# Patient Record
Sex: Male | Born: 1980 | Race: Black or African American | Hispanic: No | Marital: Single | Smoking: Current every day smoker
Health system: Southern US, Community
[De-identification: ages and names within clinical notes are randomized; demographics above are authoritative.]

## PROBLEM LIST (undated history)

## (undated) DIAGNOSIS — S71139A Puncture wound without foreign body, unspecified thigh, initial encounter: Secondary | ICD-10-CM

## (undated) DIAGNOSIS — Z87442 Personal history of urinary calculi: Secondary | ICD-10-CM

## (undated) DIAGNOSIS — W3400XA Accidental discharge from unspecified firearms or gun, initial encounter: Secondary | ICD-10-CM

---
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
## 2002-04-01 ENCOUNTER — Emergency Department (HOSPITAL_COMMUNITY): Admission: EM | Admit: 2002-04-01 | Discharge: 2002-04-01 | Payer: Self-pay | Admitting: Emergency Medicine

## 2004-01-22 ENCOUNTER — Emergency Department (HOSPITAL_COMMUNITY): Admission: EM | Admit: 2004-01-22 | Discharge: 2004-01-22 | Payer: Self-pay | Admitting: Emergency Medicine

## 2004-01-23 ENCOUNTER — Emergency Department (HOSPITAL_COMMUNITY): Admission: EM | Admit: 2004-01-23 | Discharge: 2004-01-23 | Payer: Self-pay | Admitting: Emergency Medicine

## 2004-01-24 ENCOUNTER — Emergency Department (HOSPITAL_COMMUNITY): Admission: EM | Admit: 2004-01-24 | Discharge: 2004-01-24 | Payer: Self-pay | Admitting: Emergency Medicine

## 2005-04-07 ENCOUNTER — Emergency Department (HOSPITAL_COMMUNITY): Admission: EM | Admit: 2005-04-07 | Discharge: 2005-04-07 | Payer: Self-pay | Admitting: Emergency Medicine

## 2007-01-03 ENCOUNTER — Emergency Department (HOSPITAL_COMMUNITY): Admission: EM | Admit: 2007-01-03 | Discharge: 2007-01-03 | Payer: Self-pay | Admitting: Emergency Medicine

## 2007-01-06 ENCOUNTER — Emergency Department (HOSPITAL_COMMUNITY): Admission: EM | Admit: 2007-01-06 | Discharge: 2007-01-06 | Payer: Self-pay | Admitting: Emergency Medicine

## 2007-01-23 ENCOUNTER — Emergency Department (HOSPITAL_COMMUNITY): Admission: EM | Admit: 2007-01-23 | Discharge: 2007-01-23 | Payer: Self-pay | Admitting: Emergency Medicine

## 2007-01-25 ENCOUNTER — Emergency Department (HOSPITAL_COMMUNITY): Admission: EM | Admit: 2007-01-25 | Discharge: 2007-01-25 | Payer: Self-pay | Admitting: Emergency Medicine

## 2007-02-07 ENCOUNTER — Emergency Department (HOSPITAL_COMMUNITY): Admission: EM | Admit: 2007-02-07 | Discharge: 2007-02-07 | Payer: Self-pay | Admitting: Emergency Medicine

## 2007-02-11 ENCOUNTER — Emergency Department (HOSPITAL_COMMUNITY): Admission: EM | Admit: 2007-02-11 | Discharge: 2007-02-11 | Payer: Self-pay | Admitting: Emergency Medicine

## 2007-02-15 ENCOUNTER — Emergency Department (HOSPITAL_COMMUNITY): Admission: EM | Admit: 2007-02-15 | Discharge: 2007-02-16 | Payer: Self-pay | Admitting: Emergency Medicine

## 2007-04-10 ENCOUNTER — Emergency Department (HOSPITAL_COMMUNITY): Admission: EM | Admit: 2007-04-10 | Discharge: 2007-04-10 | Payer: Self-pay | Admitting: Emergency Medicine

## 2007-05-15 ENCOUNTER — Emergency Department (HOSPITAL_COMMUNITY): Admission: EM | Admit: 2007-05-15 | Discharge: 2007-05-15 | Payer: Self-pay | Admitting: Emergency Medicine

## 2009-09-20 ENCOUNTER — Emergency Department (HOSPITAL_COMMUNITY): Admission: EM | Admit: 2009-09-20 | Discharge: 2009-09-20 | Payer: Self-pay | Admitting: Emergency Medicine

## 2009-09-21 ENCOUNTER — Emergency Department (HOSPITAL_COMMUNITY): Admission: EM | Admit: 2009-09-21 | Discharge: 2009-09-21 | Payer: Self-pay | Admitting: Emergency Medicine

## 2010-03-29 ENCOUNTER — Emergency Department (HOSPITAL_COMMUNITY): Admission: EM | Admit: 2010-03-29 | Discharge: 2010-03-29 | Payer: Self-pay | Admitting: Emergency Medicine

## 2010-10-20 LAB — URINE CULTURE: Culture: NO GROWTH

## 2010-10-20 LAB — URINE MICROSCOPIC-ADD ON

## 2010-10-20 LAB — URINALYSIS, ROUTINE W REFLEX MICROSCOPIC
Bilirubin Urine: NEGATIVE
Leukocytes, UA: NEGATIVE
Nitrite: NEGATIVE
Specific Gravity, Urine: 1.02 (ref 1.005–1.030)
Urobilinogen, UA: 0.2 mg/dL (ref 0.0–1.0)
pH: 5.5 (ref 5.0–8.0)

## 2010-10-31 ENCOUNTER — Emergency Department (HOSPITAL_COMMUNITY)
Admission: EM | Admit: 2010-10-31 | Discharge: 2010-10-31 | Disposition: A | Discharge status: Home / Self Care | Payer: Self-pay | Attending: Emergency Medicine | Admitting: Emergency Medicine

## 2010-10-31 DIAGNOSIS — K089 Disorder of teeth and supporting structures, unspecified: Secondary | ICD-10-CM | POA: Insufficient documentation

## 2010-10-31 DIAGNOSIS — K047 Periapical abscess without sinus: Secondary | ICD-10-CM | POA: Insufficient documentation

## 2011-01-25 ENCOUNTER — Emergency Department (HOSPITAL_COMMUNITY)
Admission: EM | Admit: 2011-01-25 | Discharge: 2011-01-25 | Disposition: A | Discharge status: Home / Self Care | Payer: Self-pay | Attending: Emergency Medicine | Admitting: Emergency Medicine

## 2011-01-25 DIAGNOSIS — X58XXXA Exposure to other specified factors, initial encounter: Secondary | ICD-10-CM | POA: Insufficient documentation

## 2011-01-25 DIAGNOSIS — S335XXA Sprain of ligaments of lumbar spine, initial encounter: Secondary | ICD-10-CM | POA: Insufficient documentation

## 2011-01-25 DIAGNOSIS — M549 Dorsalgia, unspecified: Secondary | ICD-10-CM | POA: Insufficient documentation

## 2011-01-25 DIAGNOSIS — K029 Dental caries, unspecified: Secondary | ICD-10-CM | POA: Insufficient documentation

## 2011-02-19 ENCOUNTER — Encounter: Payer: Self-pay | Admitting: *Deleted

## 2011-02-19 ENCOUNTER — Emergency Department (HOSPITAL_COMMUNITY)
Admission: EM | Admit: 2011-02-19 | Discharge: 2011-02-19 | Disposition: A | Discharge status: Home / Self Care | Payer: Self-pay | Attending: Emergency Medicine | Admitting: Emergency Medicine

## 2011-02-19 DIAGNOSIS — Z87891 Personal history of nicotine dependence: Secondary | ICD-10-CM | POA: Insufficient documentation

## 2011-02-19 DIAGNOSIS — R319 Hematuria, unspecified: Secondary | ICD-10-CM | POA: Insufficient documentation

## 2011-02-19 DIAGNOSIS — M545 Low back pain, unspecified: Secondary | ICD-10-CM | POA: Insufficient documentation

## 2011-02-19 LAB — URINALYSIS, ROUTINE W REFLEX MICROSCOPIC
Bilirubin Urine: NEGATIVE
Ketones, ur: NEGATIVE mg/dL
Leukocytes, UA: NEGATIVE
Nitrite: NEGATIVE
Specific Gravity, Urine: >1.03 — ABNORMAL HIGH (ref 1.005–1.030)
Urobilinogen, UA: 0.2 mg/dL (ref 0.0–1.0)

## 2011-02-19 MED ORDER — CIPROFLOXACIN HCL 250 MG PO TABS
500.0000 mg | ORAL_TABLET | Freq: Once | ORAL | Status: AC
Start: 2011-02-19 — End: 2011-02-19
  Administered 2011-02-19: 500 mg via ORAL
  Filled 2011-02-19: qty 2

## 2011-02-19 MED ORDER — TRAMADOL HCL 50 MG PO TABS
50.0000 mg | ORAL_TABLET | Freq: Once | ORAL | Status: AC
  Administered 2011-02-19: 50 mg via ORAL
  Filled 2011-02-19: qty 1

## 2011-02-19 MED ORDER — IBUPROFEN 800 MG PO TABS: 800.0000 mg | ORAL_TABLET | Freq: Three times a day (TID) | ORAL | Status: AC

## 2011-02-19 MED ORDER — CIPROFLOXACIN HCL 500 MG PO TABS: 500.0000 mg | ORAL_TABLET | Freq: Two times a day (BID) | ORAL | Status: AC

## 2011-02-19 NOTE — ED Notes (Signed)
Low back pain for 3 days. Dysuria this am after intercourse.  Denies d/c

## 2011-02-19 NOTE — ED Provider Notes (Signed)
History     Chief Complaint  Patient presents with  . 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 States that with bending and turning pain is worse. Has not taken OTC medication. States he has also had some burning with urination that began this morning. Denies fever, chills, nausea, vomiting.  Patient is a 30 y.o. male presenting with back pain. The history is provided by the patient.  Back Pain  This is a new problem. The current episode started more than 2 days ago. The problem occurs constantly. The problem has not changed since onset.The pain is associated with lifting heavy objects. The pain is present in the lumbar spine. The pain does not radiate. The pain is moderate. The symptoms are aggravated by bending and twisting. Pertinent negatives include no numbness. He has tried nothing for the symptoms.    History reviewed. No pertinent past medical history.  History reviewed. No pertinent past surgical history.  History reviewed. No pertinent family history.  History  Substance Use Topics  . Smoking status: Former Games developer  . Smokeless tobacco: Not on file  . Alcohol Use: Yes     occasionally      Review of Systems  Musculoskeletal: Positive for back pain.  Neurological: Negative for numbness.  All other systems reviewed and are negative.    Physical Exam  BP 126/71  Pulse 96  Temp(Src) 98.6 F (37 C) (Oral)  Resp 20  Ht 5\' 7"  (1.702 m)  Wt 185 lb (83.915 kg)  BMI 28.97 kg/m2  SpO2 98%  Physical Exam  Nursing note and vitals reviewed. Constitutional: He is oriented to person, place, and time. He appears well-developed and well-nourished.  HENT:  Head: Normocephalic and atraumatic.  Eyes: EOM are normal.  Neck: Normal range of motion.  Cardiovascular: Normal rate and normal heart sounds.   Pulmonary/Chest: Effort normal and breath sounds normal.  Abdominal: Soft.  Genitourinary: Penis  normal.  Musculoskeletal: Normal range of motion.       Lumbar paraspinal tenderness to palpation. No cervical, thoracic or lumbar spinal pain.  Neurological: He is alert and oriented to person, place, and time. He has normal reflexes.  Skin: Skin is warm and dry.    ED Course  Procedures  MDM       Nicoletta Dress. Colon Branch, MD 02/19/11 [phone]

## 2011-02-19 NOTE — ED Notes (Signed)
Pt c/o back pain x 3 days; pt states he went to bathroom tonight it was uncomfortable

## 2011-04-30 ENCOUNTER — Emergency Department (HOSPITAL_COMMUNITY)
Admission: EM | Admit: 2011-04-30 | Discharge: 2011-05-01 | Disposition: A | Discharge status: Home / Self Care | Payer: Self-pay | Attending: Emergency Medicine | Admitting: Emergency Medicine

## 2011-04-30 ENCOUNTER — Encounter (HOSPITAL_COMMUNITY): Payer: Self-pay | Admitting: *Deleted

## 2011-04-30 DIAGNOSIS — R112 Nausea with vomiting, unspecified: Secondary | ICD-10-CM | POA: Insufficient documentation

## 2011-04-30 DIAGNOSIS — R3129 Other microscopic hematuria: Secondary | ICD-10-CM | POA: Insufficient documentation

## 2011-04-30 DIAGNOSIS — H9319 Tinnitus, unspecified ear: Secondary | ICD-10-CM | POA: Insufficient documentation

## 2011-04-30 DIAGNOSIS — R52 Pain, unspecified: Secondary | ICD-10-CM | POA: Insufficient documentation

## 2011-04-30 DIAGNOSIS — M549 Dorsalgia, unspecified: Secondary | ICD-10-CM | POA: Insufficient documentation

## 2011-04-30 DIAGNOSIS — D509 Iron deficiency anemia, unspecified: Secondary | ICD-10-CM | POA: Insufficient documentation

## 2011-04-30 DIAGNOSIS — K089 Disorder of teeth and supporting structures, unspecified: Secondary | ICD-10-CM | POA: Insufficient documentation

## 2011-04-30 LAB — DIFFERENTIAL
Basophils Absolute: 0 10*3/uL (ref 0.0–0.1)
Lymphocytes Relative: 35 % (ref 12–46)
Lymphs Abs: 3.8 10*3/uL (ref 0.7–4.0)
Monocytes Absolute: 0.9 10*3/uL (ref 0.1–1.0)
Monocytes Relative: 8 % (ref 3–12)
Neutro Abs: 6.1 10*3/uL (ref 1.7–7.7)

## 2011-04-30 LAB — URINALYSIS, ROUTINE W REFLEX MICROSCOPIC
Glucose, UA: NEGATIVE mg/dL
Leukocytes, UA: NEGATIVE
pH: 6 (ref 5.0–8.0)

## 2011-04-30 LAB — URINE MICROSCOPIC-ADD ON

## 2011-04-30 LAB — CBC
HCT: 49.3 % (ref 39.0–52.0)
Hemoglobin: 17 g/dL (ref 13.0–17.0)
RBC: 7.42 MIL/uL — ABNORMAL HIGH (ref 4.22–5.81)
WBC: 10.9 10*3/uL — ABNORMAL HIGH (ref 4.0–10.5)

## 2011-04-30 LAB — COMPREHENSIVE METABOLIC PANEL
AST: 17 U/L (ref 0–37)
BUN: 11 mg/dL (ref 6–23)
CO2: 26 mEq/L (ref 19–32)
Chloride: 102 mEq/L (ref 96–112)
Creatinine, Ser: 1.1 mg/dL (ref 0.50–1.35)
GFR calc non Af Amer: >60 mL/min (ref >60)
Glucose, Bld: 85 mg/dL (ref 70–99)
Total Bilirubin: 0.5 mg/dL (ref 0.3–1.2)

## 2011-04-30 NOTE — ED Provider Notes (Signed)
History     CSN: [id number] Arrival date & time: 04/30/2011 10:11 PM  Chief Complaint  Patient presents with  . Nausea  . Generalized Body Aches  . Tinnitus  . Dental Pain  . Back Pain    (Consider location/radiation/quality/duration/timing/severity/associated sxs/prior treatment) HPI Comments: Pt has achiness throughout  his back which he attributes to a bed that is too soft.  yest he had nausea which he thinks is due to "stopping smoking weed"  No nausea presently.  Vomited x 1 yest.  No diarrhea or fever.  No UTI sxs.  Had a short episode of ringing in his ears today but gone now.  Patient is a 30 y.o. male presenting with tooth pain and back pain. The history is provided by the patient. No language interpreter was used.  Dental PainPrimary symptoms do not include fever. The symptoms began 2 days ago. The symptoms are improving. The symptoms occur intermittently.   Back Pain  Pertinent negatives include no fever and no dysuria.    History reviewed. No pertinent past medical history.  History reviewed. No pertinent past surgical history.  Family History  Problem Relation Age of Onset  . Hypertension Father   . Diabetes Other     History  Substance Use Topics  . Smoking status: Current Everyday Smoker  . Smokeless tobacco: Not on file  . Alcohol Use: Yes     occasionally      Review of Systems  Constitutional: Negative for fever and chills.  HENT: Positive for tinnitus.   Gastrointestinal: Positive for nausea and vomiting. Negative for diarrhea.  Genitourinary: Negative for dysuria, urgency, hematuria and flank pain.  Musculoskeletal: Positive for back pain.  All other systems reviewed and are negative.    Allergies  Review of patient's allergies indicates no known allergies.  Home Medications   Current Outpatient Rx  Name Route Sig Dispense Refill  . HYDROCODONE-ACETAMINOPHEN 5-500 MG PO TABS Oral Take 1 tablet by mouth every 6 (six) hours as needed.  pain       BP 130/80  Pulse 83  Temp 98.2 F (36.8 C)  Ht 5\' 7"  (1.702 m)  Wt 185 lb (83.915 kg)  BMI 28.97 kg/m2  SpO2 99%  Physical Exam  Nursing note and vitals reviewed. Constitutional: He is oriented to person, place, and time. He appears well-developed and well-nourished.  HENT:  Head: Normocephalic and atraumatic.  Eyes: EOM are normal.  Neck: Normal range of motion.  Cardiovascular: Normal rate and regular rhythm.   Pulmonary/Chest: Effort normal and breath sounds normal. No accessory muscle usage. Not tachypneic. No respiratory distress.  Abdominal: Soft. He exhibits no distension and no mass. There is no tenderness. There is no rebound and no guarding.  Musculoskeletal: Normal range of motion.       Arms: Neurological: He is alert and oriented to person, place, and time.  Skin: Skin is warm and dry.  Psychiatric: He has a normal mood and affect. Judgment normal.    ED Course  Procedures (including critical care time)  Labs Reviewed  URINALYSIS, ROUTINE W REFLEX MICROSCOPIC - Abnormal; Notable for the following:    Specific Gravity, Urine >1.030 (*)    Hgb urine dipstick LARGE (*)    Bilirubin Urine SMALL (*)    Ketones, ur TRACE (*)    All other components within normal limits  CBC - Abnormal; Notable for the following:    WBC 10.9 (*)    RBC 7.42 (*)    MCV 66.4 (*)  MCH 22.9 (*)    RDW 15.6 (*)    All other components within normal limits  DIFFERENTIAL  URINE MICROSCOPIC-ADD ON  COMPREHENSIVE METABOLIC PANEL  LIPASE, BLOOD   No results found.   No diagnosis found.    MDM          Worthy Rancher, PA 05/01/11 [phone]

## 2011-04-30 NOTE — ED Notes (Signed)
Pt also c/o lower back.

## 2011-04-30 NOTE — ED Notes (Signed)
Pt c/o body aches, nausea, and ringing in his ears x 2 days.

## 2011-05-02 ENCOUNTER — Other Ambulatory Visit (HOSPITAL_COMMUNITY): Payer: Self-pay | Admitting: Urology

## 2011-05-02 DIAGNOSIS — R3129 Other microscopic hematuria: Secondary | ICD-10-CM

## 2011-05-03 NOTE — ED Provider Notes (Signed)
Medical screening examination/treatment/procedure(s) were performed by non-physician practitioner and as supervising physician I was immediately available for consultation/collaboration.  Braeson Rupe S. Einar Nolasco, MD 05/03/11 0810 

## 2011-05-04 ENCOUNTER — Ambulatory Visit (HOSPITAL_COMMUNITY)
Admission: RE | Admit: 2011-05-04 | Discharge: 2011-05-04 | Disposition: A | Discharge status: Home / Self Care | Payer: Self-pay | Source: Ambulatory Visit | Attending: Urology | Admitting: Urology

## 2011-05-04 DIAGNOSIS — R3129 Other microscopic hematuria: Secondary | ICD-10-CM | POA: Insufficient documentation

## 2011-05-04 MED ORDER — IOHEXOL 300 MG/ML  SOLN
150.0000 mL | Freq: Once | INTRAMUSCULAR | Status: AC | PRN
  Administered 2011-05-04: 150 mL via INTRAVENOUS

## 2011-05-06 ENCOUNTER — Other Ambulatory Visit: Payer: Self-pay

## 2011-05-06 ENCOUNTER — Emergency Department (HOSPITAL_COMMUNITY): Payer: Self-pay

## 2011-05-06 ENCOUNTER — Encounter (HOSPITAL_COMMUNITY): Payer: Self-pay | Admitting: *Deleted

## 2011-05-06 ENCOUNTER — Emergency Department (HOSPITAL_COMMUNITY)
Admission: EM | Admit: 2011-05-06 | Discharge: 2011-05-06 | Disposition: A | Discharge status: Home / Self Care | Payer: Self-pay | Attending: Emergency Medicine | Admitting: Emergency Medicine

## 2011-05-06 DIAGNOSIS — R51 Headache: Secondary | ICD-10-CM | POA: Insufficient documentation

## 2011-05-06 DIAGNOSIS — R002 Palpitations: Secondary | ICD-10-CM | POA: Insufficient documentation

## 2011-05-06 DIAGNOSIS — Z79899 Other long term (current) drug therapy: Secondary | ICD-10-CM | POA: Insufficient documentation

## 2011-05-06 DIAGNOSIS — R000 Tachycardia, unspecified: Secondary | ICD-10-CM | POA: Insufficient documentation

## 2011-05-06 DIAGNOSIS — F17200 Nicotine dependence, unspecified, uncomplicated: Secondary | ICD-10-CM | POA: Insufficient documentation

## 2011-05-06 LAB — DIFFERENTIAL
Basophils Relative: 0 % (ref 0–1)
Eosinophils Absolute: 0.2 10*3/uL (ref 0.0–0.7)
Monocytes Absolute: 1 10*3/uL (ref 0.1–1.0)
Monocytes Relative: 11 % (ref 3–12)
Neutrophils Relative %: 53 % (ref 43–77)

## 2011-05-06 LAB — BASIC METABOLIC PANEL
BUN: 14 mg/dL (ref 6–23)
Calcium: 10.2 mg/dL (ref 8.4–10.5)
Creatinine, Ser: 0.98 mg/dL (ref 0.50–1.35)
GFR calc Af Amer: >90 mL/min (ref >90)
GFR calc non Af Amer: >90 mL/min (ref >90)

## 2011-05-06 LAB — CBC
Hemoglobin: 16.8 g/dL (ref 13.0–17.0)
MCH: 23 pg — ABNORMAL LOW (ref 26.0–34.0)
MCHC: 34.9 g/dL (ref 30.0–36.0)

## 2011-05-06 MED ORDER — ACETAMINOPHEN 325 MG PO TABS
650.0000 mg | ORAL_TABLET | Freq: Once | ORAL | Status: AC
  Administered 2011-05-06: 650 mg via ORAL
  Filled 2011-05-06: qty 1

## 2011-05-06 NOTE — ED Notes (Signed)
Pt states that his heart has been racing off and on x 1 week. States that he has stopped smoking marijuana last Thursday. Pt was seen last weekend for nausea.

## 2011-05-06 NOTE — ED Provider Notes (Signed)
History     CSN: [id number] Arrival date & time: 05/06/2011  5:42 PM  Chief Complaint  Patient presents with  . Tachycardia    (Consider location/radiation/quality/duration/timing/severity/associated sxs/prior treatment) HPI Comments: 30 year old male patient presents with chief complaint of heart racing intermittently for the past week. Patient states he notices lasts for a few minutes and resolves. Reason he presents today as he had in conjunction with a mild headache. His headache is diffuse with no associated photophobia neurologic symptoms. Headache resolved with rest. Has no associated symptoms with the palpitations. He has no chest pain, shortness of breath, diaphoresis, nausea, vomiting. There is no inciting or alleviating events. Patient has been seen numerous department a few times in the past couple of weeks secondary to hematuria and flank pain. CT scan was performed and unremarkable. He has no specific exacerbating or alleviating measures and states that he may have this secondary to anxiety. He does state he is anxious and also recently quit smoking marijuana  The history is provided by the patient. No language interpreter was used.    History reviewed. No pertinent past medical history.  History reviewed. No pertinent past surgical history.  Family History  Problem Relation Age of Onset  . Hypertension Father   . Diabetes Other     History  Substance Use Topics  . Smoking status: Current Everyday Smoker  . Smokeless tobacco: Not on file  . Alcohol Use: Yes     occasionally      Review of Systems  Constitutional: Negative for fever, activity change, appetite change and fatigue.  HENT: Negative for congestion, sore throat, rhinorrhea, neck pain and neck stiffness.   Respiratory: Negative for cough and shortness of breath.   Cardiovascular: Positive for palpitations. Negative for chest pain.  Gastrointestinal: Negative for nausea, vomiting and abdominal pain.    Genitourinary: Negative for dysuria, urgency, frequency and flank pain.  Musculoskeletal: Negative for back pain and arthralgias.  Neurological: Positive for headaches. Negative for dizziness, weakness, light-headedness and numbness.  All other systems reviewed and are negative.    Allergies  Review of patient's allergies indicates no known allergies.  Home Medications   Current Outpatient Rx  Name Route Sig Dispense Refill  . ACETAMINOPHEN 500 MG PO TABS Oral Take 500 mg by mouth as needed. For pain     . GOODY HEADACHE PO Oral Take 1 packet by mouth as needed. Dental pain     . HYDROCODONE-ACETAMINOPHEN 5-500 MG PO TABS Oral Take 1 tablet by mouth every 6 (six) hours as needed. pain     . IBUPROFEN 200 MG PO TABS Oral Take 200 mg by mouth as needed. For pain     . MICONAZOLE NITRATE 2 % EX AERO Apply externally Apply 1 spray topically as needed. For athletes foot     . NAPHAZOLINE-GLYCERIN 0.012-0.2 % OP SOLN Both Eyes Place 1-2 drops into both eyes every 4 (four) hours as needed. For red eye relief     . SULFAMETHOXAZOLE-TMP DS 800-160 MG PO TABS Oral Take 1 tablet by mouth 2 (two) times daily.        BP 106/71  Pulse 88  Temp(Src) 98.9 F (37.2 C) (Oral)  Resp 20  Ht 5\' 7"  (1.702 m)  Wt 185 lb (83.915 kg)  BMI 28.97 kg/m2  SpO2 96%  Physical Exam  Nursing note and vitals reviewed. Constitutional: He is oriented to person, place, and time. He appears well-developed and well-nourished. No distress.  HENT:  Head: Normocephalic and  atraumatic.  Mouth/Throat: Oropharynx is clear and moist.  Eyes: Conjunctivae and EOM are normal. Pupils are equal, round, and reactive to light.  Neck: Normal range of motion. Neck supple.  Cardiovascular: Normal rate, regular rhythm and intact distal pulses.  Exam reveals friction rub. Exam reveals no gallop.   No murmur heard. Pulmonary/Chest: Effort normal and breath sounds normal. No respiratory distress.  Abdominal: Soft. Bowel sounds  are normal. There is no tenderness.  Musculoskeletal: Normal range of motion. He exhibits no tenderness.  Neurological: He is alert and oriented to person, place, and time.  Skin: Skin is warm and dry. No rash noted.    ED Course  Procedures (including critical care time)  Labs Reviewed  CBC - Abnormal; Notable for the following:    RBC 7.31 (*)    MCV 65.9 (*)    MCH 23.0 (*)    All other components within normal limits  DIFFERENTIAL  BASIC METABOLIC PANEL   Dg Chest 2 View  05/06/2011  *RADIOLOGY REPORT*  Clinical Data: 30 year old male with palpitations and chest discomfort.  CHEST - 2 VIEW  Comparison: 02/15/2007  Findings: The cardiomediastinal silhouette is unremarkable. Mild peribronchial thickening is unchanged. There is no evidence of focal airspace disease, pulmonary edema, pulmonary nodule/mass, pleural effusion, or pneumothorax. No acute bony abnormalities are identified.  IMPRESSION: No evidence of acute cardiopulmonary disease.  Original Report Authenticated By: Rosendo Gros, M.D.     1. Palpitations      Date: 05/06/2011  Rate: 83  Rhythm: normal sinus rhythm  QRS Axis: normal  Intervals: normal  ST/T Wave abnormalities: early repolarization  Conduction Disutrbances:none  Narrative Interpretation:   Old EKG Reviewed: unchanged   MDM  Nonspecific palpitations with no associated chest pain shortness breath. For these are likely secondary to anxiety. Laboratory studies were performed and unremarkable. Chest x-ray also unremarkable. EKG with no specific abnormalities. I reviewed the CT scan result the patient. I instructed him to followup with his urologist as well as to obtain a primary care physician for further evaluation of this problem. I also stated a primary care physician may consider placing him on anxiety medications. Patient remained stable one emergency department. He did not have any episodes of these palpitations. This time he is safe for discharge to  home. He is provided the signs and symptoms for which to return the emergency dept        Dayton Bailiff, MD 05/06/11 1954

## 2011-05-12 LAB — URINALYSIS, ROUTINE W REFLEX MICROSCOPIC
Bilirubin Urine: NEGATIVE
Glucose, UA: NEGATIVE
Ketones, ur: NEGATIVE
Leukocytes, UA: NEGATIVE
Nitrite: NEGATIVE
Protein, ur: NEGATIVE
Specific Gravity, Urine: 1.02
Urobilinogen, UA: 1
pH: 6.5

## 2011-05-12 LAB — URINE MICROSCOPIC-ADD ON

## 2011-05-12 LAB — RPR: RPR Ser Ql: NONREACTIVE

## 2011-05-12 LAB — GC/CHLAMYDIA PROBE AMP, GENITAL: Chlamydia, DNA Probe: NEGATIVE

## 2011-05-13 LAB — URINALYSIS, ROUTINE W REFLEX MICROSCOPIC
Bilirubin Urine: NEGATIVE
Glucose, UA: NEGATIVE
Specific Gravity, Urine: 1.02
Urobilinogen, UA: 0.2
pH: 6

## 2011-05-13 LAB — URINE MICROSCOPIC-ADD ON

## 2011-05-16 LAB — D-DIMER, QUANTITATIVE: D-Dimer, Quant: 0.22

## 2011-07-28 ENCOUNTER — Emergency Department (HOSPITAL_COMMUNITY): Payer: Self-pay

## 2011-07-28 ENCOUNTER — Emergency Department (HOSPITAL_COMMUNITY)
Admission: EM | Admit: 2011-07-28 | Discharge: 2011-07-28 | Disposition: A | Discharge status: Home / Self Care | Payer: Self-pay | Attending: Emergency Medicine | Admitting: Emergency Medicine

## 2011-07-28 ENCOUNTER — Encounter (HOSPITAL_COMMUNITY): Payer: Self-pay

## 2011-07-28 DIAGNOSIS — M545 Low back pain, unspecified: Secondary | ICD-10-CM | POA: Insufficient documentation

## 2011-07-28 DIAGNOSIS — M533 Sacrococcygeal disorders, not elsewhere classified: Secondary | ICD-10-CM | POA: Insufficient documentation

## 2011-07-28 DIAGNOSIS — M549 Dorsalgia, unspecified: Secondary | ICD-10-CM

## 2011-07-28 DIAGNOSIS — R0602 Shortness of breath: Secondary | ICD-10-CM | POA: Insufficient documentation

## 2011-07-28 DIAGNOSIS — F17200 Nicotine dependence, unspecified, uncomplicated: Secondary | ICD-10-CM | POA: Insufficient documentation

## 2011-07-28 MED ORDER — HYDROCODONE-ACETAMINOPHEN 5-325 MG PO TABS
ORAL_TABLET | ORAL | Status: AC
  Filled 2011-07-28: qty 1

## 2011-07-28 MED ORDER — HYDROCODONE-ACETAMINOPHEN 5-325 MG PO TABS: 1.0000 | ORAL_TABLET | ORAL | Status: AC | PRN

## 2011-07-28 MED ORDER — HYDROCODONE-ACETAMINOPHEN 5-325 MG PO TABS
1.0000 | ORAL_TABLET | Freq: Once | ORAL | Status: AC
  Administered 2011-07-28: 1 via ORAL

## 2011-07-28 NOTE — ED Provider Notes (Signed)
Scribed for [employer]. Colon Branch, MD, the patient was seen in room APA03/APA03 . This chart was scribed by Ellie Lunch.   CSN: [id number]  Arrival date & time 07/28/11  1012   First MD Initiated Contact with Patient 07/28/11 1035      Chief Complaint  Patient presents with  . Back Pain    (Consider location/radiation/quality/duration/timing/severity/associated sxs/prior treatment) HPI Joshua Blevins is a 30 y.o. male who presents to the Emergency Department complaining of 1.5 weeks of gradually worsening bilateral lower back pain. Pt denies any specific strain or injury.  Pain is worse with movement.  Pain is improved with bending forward. Pt treated pain for the first time this morning with hydrocodone with moderate improvement. Pt reports a  history of lower back pain with unclear etiology.  Additionally Pt c/o 1 week of intermittent SOB associated with smoking. SOB is worse with exertion.  Past Medical History  Diagnosis Date  . Hematuria     History reviewed. No pertinent past surgical history.  Family History  Problem Relation Age of Onset  . Hypertension Father   . Diabetes Other     History  Substance Use Topics  . Smoking status: Current Everyday Smoker  . Smokeless tobacco: Not on file  . Alcohol Use: Yes     occasionally  currently unemployed   Review of Systems 10 Systems reviewed and are negative for acute change except as noted in the HPI.   Allergies  Review of patient's allergies indicates no known allergies.  Home Medications   Current Outpatient Rx  Name Route Sig Dispense Refill  . ACETAMINOPHEN 500 MG PO TABS Oral Take 500 mg by mouth as needed. For pain     . GOODY HEADACHE PO Oral Take 1 packet by mouth as needed. Dental pain     . HYDROCODONE-ACETAMINOPHEN 5-500 MG PO TABS Oral Take 1 tablet by mouth every 6 (six) hours as needed. pain     . IBUPROFEN 200 MG PO TABS Oral Take 200 mg by mouth as needed. For pain     . MICONAZOLE NITRATE 2 % EX  AERO Apply externally Apply 1 spray topically as needed. For athletes foot     . NAPHAZOLINE-GLYCERIN 0.012-0.2 % OP SOLN Both Eyes Place 1-2 drops into both eyes every 4 (four) hours as needed. For red eye relief       BP 139/87  Pulse 89  Temp(Src) 98.4 F (36.9 C) (Oral)  Resp 18  Ht 5\' 7"  (1.702 m)  Wt 185 lb (83.915 kg)  BMI 28.97 kg/m2  SpO2 96%  Physical Exam  Nursing note and vitals reviewed. Constitutional: He is oriented to person, place, and time. He appears well-developed and well-nourished.  HENT:  Head: Normocephalic and atraumatic.  Eyes: EOM are normal. Pupils are equal, round, and reactive to light.  Neck: Normal range of motion. Neck supple.  Cardiovascular: Normal rate, regular rhythm and normal heart sounds.   Pulmonary/Chest: Effort normal and breath sounds normal.  Musculoskeletal: Normal range of motion.       Paraspinal muscle tenderness in lumbar and sacral region to percussion. No spinal tenderness to percussion.   Neurological: He is alert and oriented to person, place, and time.  Skin: Skin is warm and dry.    ED Course  Procedures (including critical care time) DIAGNOSTIC STUDIES: Oxygen Saturation is 96% on RA, normal by my interpretation.    COORDINATION OF CARE:  Dg Lumbar Spine Complete  07/28/2011  *RADIOLOGY REPORT*  Clinical Data: Lower back pain  LUMBAR SPINE - COMPLETE 4+ VIEW  Comparison: CT abdomen pelvis dated 05/04/2011  Findings: Five lumbar-type vertebral bodies.  Normal lumbar lordosis.  No evidence of fracture or dislocation.  Vertebral body heights and intervertebral disc spaces are maintained.  Visualized bony pelvis appears intact.  IMPRESSION: Normal lumbar spine radiographs.  Original Report Authenticated By: Charline Bills, M.D.        MDM  Patient with back pain for two weeks without a known injury. Pain increases with activity. LS spine films normal. Relief with analgesic. Pt stable in ED with no significant  deterioration in condition.The patient appears reasonably screened and/or stabilized for discharge and I doubt any other medical condition or other [facility] requiring further screening, evaluation, or treatment in the ED at this time prior to discharge.  I personally performed the services described in this documentation, which was scribed in my presence. The recorded information has been reviewed and considered.   MDM Reviewed: nursing note and vitals Interpretation: x-ray         Nicoletta Dress. Colon Branch, MD 07/28/11 1249

## 2011-07-28 NOTE — ED Notes (Signed)
MD at bedside. 

## 2011-07-28 NOTE — ED Notes (Signed)
Pt also reports has noticed intermittent SOB recently.

## 2011-07-28 NOTE — ED Notes (Signed)
Pt c/o mid to lower back pain x 1 week.  Denies injury.  Says pain worse with movement.

## 2011-07-28 NOTE — ED Notes (Signed)
Patient transported to X-ray 

## 2011-07-28 NOTE — ED Notes (Signed)
C/o lower back pain for a couple of weeks, pain eases with rest

## 2011-08-13 ENCOUNTER — Emergency Department (HOSPITAL_COMMUNITY): Payer: Self-pay

## 2011-08-13 ENCOUNTER — Emergency Department (HOSPITAL_COMMUNITY)
Admission: EM | Admit: 2011-08-13 | Discharge: 2011-08-13 | Disposition: A | Discharge status: Home / Self Care | Payer: Self-pay | Attending: Emergency Medicine | Admitting: Emergency Medicine

## 2011-08-13 ENCOUNTER — Encounter (HOSPITAL_COMMUNITY): Payer: Self-pay | Admitting: Emergency Medicine

## 2011-08-13 DIAGNOSIS — Y249XXA Unspecified firearm discharge, undetermined intent, initial encounter: Secondary | ICD-10-CM | POA: Insufficient documentation

## 2011-08-13 DIAGNOSIS — F17200 Nicotine dependence, unspecified, uncomplicated: Secondary | ICD-10-CM | POA: Insufficient documentation

## 2011-08-13 DIAGNOSIS — S71009A Unspecified open wound, unspecified hip, initial encounter: Secondary | ICD-10-CM | POA: Insufficient documentation

## 2011-08-13 DIAGNOSIS — S71139A Puncture wound without foreign body, unspecified thigh, initial encounter: Secondary | ICD-10-CM

## 2011-08-13 DIAGNOSIS — S71109A Unspecified open wound, unspecified thigh, initial encounter: Secondary | ICD-10-CM | POA: Insufficient documentation

## 2011-08-13 MED ORDER — CEPHALEXIN 500 MG PO CAPS: 500.0000 mg | ORAL_CAPSULE | Freq: Four times a day (QID) | ORAL | Status: AC

## 2011-08-13 MED ORDER — OXYCODONE-ACETAMINOPHEN 5-325 MG PO TABS: 1.0000 | ORAL_TABLET | Freq: Four times a day (QID) | ORAL | Status: DC | PRN

## 2011-08-13 MED ORDER — HYDROMORPHONE HCL PF 1 MG/ML IJ SOLN
1.0000 mg | Freq: Once | INTRAMUSCULAR | Status: AC
  Administered 2011-08-13: 1 mg via INTRAVENOUS
  Filled 2011-08-13: qty 1
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
## 2011-08-17 ENCOUNTER — Encounter (HOSPITAL_COMMUNITY): Payer: Self-pay | Admitting: *Deleted

## 2011-08-17 ENCOUNTER — Emergency Department (HOSPITAL_COMMUNITY)
Admission: EM | Admit: 2011-08-17 | Discharge: 2011-08-17 | Disposition: A | Discharge status: Home / Self Care | Payer: Self-pay | Attending: Emergency Medicine | Admitting: Emergency Medicine

## 2011-08-17 DIAGNOSIS — IMO0001 Reserved for inherently not codable concepts without codable children: Secondary | ICD-10-CM | POA: Insufficient documentation

## 2011-08-17 DIAGNOSIS — M79609 Pain in unspecified limb: Secondary | ICD-10-CM | POA: Insufficient documentation

## 2011-08-17 DIAGNOSIS — Z09 Encounter for follow-up examination after completed treatment for conditions other than malignant neoplasm: Secondary | ICD-10-CM | POA: Insufficient documentation

## 2011-08-17 DIAGNOSIS — Z5189 Encounter for other specified aftercare: Secondary | ICD-10-CM

## 2011-08-17 MED ORDER — OXYCODONE-ACETAMINOPHEN 7.5-325 MG PO TABS: 1.0000 | ORAL_TABLET | ORAL | Status: AC | PRN
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
## 2011-08-17 NOTE — ED Notes (Signed)
Telfa, cling wrap and ace bandage placed on wounds after cleaning with sterile water.  Pt tolerated well.
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
## 2011-08-22 NOTE — ED Provider Notes (Signed)
Medical screening examination/treatment/procedure(s) were performed by non-physician practitioner and as supervising physician I was immediately available for consultation/collaboration.  Nicoletta Dress. Colon Branch, MD 08/22/11 1028

## 2012-03-24 ENCOUNTER — Emergency Department (HOSPITAL_COMMUNITY)
Admission: EM | Admit: 2012-03-24 | Discharge: 2012-03-24 | Disposition: A | Discharge status: Home / Self Care | Payer: Self-pay | Attending: Emergency Medicine | Admitting: Emergency Medicine

## 2012-03-24 ENCOUNTER — Emergency Department (HOSPITAL_COMMUNITY): Payer: Self-pay

## 2012-03-24 ENCOUNTER — Encounter (HOSPITAL_COMMUNITY): Payer: Self-pay

## 2012-03-24 DIAGNOSIS — F17200 Nicotine dependence, unspecified, uncomplicated: Secondary | ICD-10-CM | POA: Insufficient documentation

## 2012-03-24 DIAGNOSIS — Z833 Family history of diabetes mellitus: Secondary | ICD-10-CM | POA: Insufficient documentation

## 2012-03-24 DIAGNOSIS — Z8249 Family history of ischemic heart disease and other diseases of the circulatory system: Secondary | ICD-10-CM | POA: Insufficient documentation

## 2012-03-24 DIAGNOSIS — B9789 Other viral agents as the cause of diseases classified elsewhere: Secondary | ICD-10-CM | POA: Insufficient documentation

## 2012-03-24 DIAGNOSIS — M79609 Pain in unspecified limb: Secondary | ICD-10-CM | POA: Insufficient documentation

## 2012-03-24 DIAGNOSIS — B349 Viral infection, unspecified: Secondary | ICD-10-CM

## 2012-03-24 DIAGNOSIS — R51 Headache: Secondary | ICD-10-CM | POA: Insufficient documentation

## 2012-03-24 LAB — URINALYSIS, ROUTINE W REFLEX MICROSCOPIC
Bilirubin Urine: NEGATIVE
Leukocytes, UA: NEGATIVE
Nitrite: NEGATIVE
Specific Gravity, Urine: 1.02 (ref 1.005–1.030)
Urobilinogen, UA: 1 mg/dL (ref 0.0–1.0)
pH: 6 (ref 5.0–8.0)

## 2012-03-24 LAB — CBC WITH DIFFERENTIAL/PLATELET
Basophils Relative: 0 % (ref 0–1)
Eosinophils Absolute: 0 10*3/uL (ref 0.0–0.7)
Hemoglobin: 15.8 g/dL (ref 13.0–17.0)
Lymphs Abs: 1.6 10*3/uL (ref 0.7–4.0)
MCH: 23.4 pg — ABNORMAL LOW (ref 26.0–34.0)
Monocytes Relative: 10 % (ref 3–12)
Neutro Abs: 8.6 10*3/uL — ABNORMAL HIGH (ref 1.7–7.7)
Neutrophils Relative %: 76 % (ref 43–77)
Platelets: 242 10*3/uL (ref 150–400)
RBC: 6.76 MIL/uL — ABNORMAL HIGH (ref 4.22–5.81)

## 2012-03-24 LAB — URINE MICROSCOPIC-ADD ON

## 2012-03-24 LAB — BASIC METABOLIC PANEL
BUN: 9 mg/dL (ref 6–23)
Chloride: 102 mEq/L (ref 96–112)
GFR calc Af Amer: >90 mL/min (ref >90)
GFR calc non Af Amer: >90 mL/min (ref >90)
Glucose, Bld: 113 mg/dL — ABNORMAL HIGH (ref 70–99)
Potassium: 3.5 mEq/L (ref 3.5–5.1)
Sodium: 136 mEq/L (ref 135–145)

## 2012-03-24 MED ORDER — MORPHINE SULFATE 4 MG/ML IJ SOLN
2.0000 mg | Freq: Once | INTRAMUSCULAR | Status: AC
  Administered 2012-03-24: 2 mg via INTRAVENOUS
  Filled 2012-03-24: qty 1

## 2012-03-24 MED ORDER — ONDANSETRON HCL 4 MG/2ML IJ SOLN
4.0000 mg | Freq: Once | INTRAMUSCULAR | Status: AC
  Administered 2012-03-24: 4 mg via INTRAVENOUS
  Filled 2012-03-24: qty 2

## 2012-03-24 MED ORDER — SODIUM CHLORIDE 0.9 % IV SOLN
Freq: Once | INTRAVENOUS | Status: AC
  Administered 2012-03-24: 05:00:00 via INTRAVENOUS

## 2012-03-24 MED ORDER — DIPHENHYDRAMINE HCL 50 MG/ML IJ SOLN
25.0000 mg | Freq: Once | INTRAMUSCULAR | Status: AC
  Administered 2012-03-24: 25 mg via INTRAVENOUS
  Filled 2012-03-24: qty 1

## 2012-03-24 MED ORDER — HYDROCODONE-ACETAMINOPHEN 5-325 MG PO TABS: 1.0000 | ORAL_TABLET | ORAL | Status: AC | PRN

## 2012-03-24 MED ORDER — SODIUM CHLORIDE 0.9 % IV BOLUS (SEPSIS)
1000.0000 mL | Freq: Once | INTRAVENOUS | Status: AC
  Administered 2012-03-24: 1000 mL via INTRAVENOUS

## 2012-03-24 MED ORDER — IBUPROFEN 800 MG PO TABS: 800.0000 mg | ORAL_TABLET | Freq: Three times a day (TID) | ORAL | Status: AC

## 2012-03-24 MED ORDER — KETOROLAC TROMETHAMINE 30 MG/ML IJ SOLN
30.0000 mg | Freq: Once | INTRAMUSCULAR | Status: AC
  Administered 2012-03-24: 30 mg via INTRAVENOUS
  Filled 2012-03-24: qty 1

## 2012-03-24 NOTE — ED Provider Notes (Signed)
History     CSN: [id number]  Arrival date & time 03/24/12  0157   First MD Initiated Contact with Patient 03/24/12 [phone]      Chief Complaint  Patient presents with  . Headache  . Leg Pain    (Consider location/radiation/quality/duration/timing/severity/associated sxs/prior treatment) HPI Joshua Blevins is a 31 y.o. male who presents to the Emergency Department complaining of headache, fever, back pain, leg pain, body aches x 3 days. He has taken tylenol with no relief. He has had mild nausea, no vomiting.  Past Medical History  Diagnosis Date  . Hematuria     History reviewed. No pertinent past surgical history.  Family History  Problem Relation Age of Onset  . Hypertension Father   . Diabetes Other     History  Substance Use Topics  . Smoking status: Current Everyday Smoker  . Smokeless tobacco: Not on file  . Alcohol Use: Yes     occasionally      Review of Systems  Constitutional: Positive for fever.       10 Systems reviewed and are negative for acute change except as noted in the HPI.  HENT: Negative for congestion.   Eyes: Negative for discharge and redness.  Respiratory: Negative for cough and shortness of breath.   Cardiovascular: Negative for chest pain.  Gastrointestinal: Positive for nausea. Negative for vomiting and abdominal pain.  Musculoskeletal: Positive for myalgias. Negative for back pain.  Skin: Negative for rash.  Neurological: Positive for headaches. Negative for syncope and numbness.  Psychiatric/Behavioral:       No behavior change.    Allergies  Review of patient's allergies indicates no known allergies.  Home Medications   Current Outpatient Rx  Name Route Sig Dispense Refill  . NAPHAZOLINE-GLYCERIN 0.012-0.2 % OP SOLN Both Eyes Place 1-2 drops into both eyes every 4 (four) hours as needed. For red eye relief       BP 122/79  Pulse 110  Temp 101 F (38.3 C) (Oral)  Resp 16  Ht 5\' 7"  (1.702 m)  Wt 180 lb (81.647 kg)  BMI  28.19 kg/m2  SpO2 98%  Physical Exam  Nursing note and vitals reviewed. Constitutional: He is oriented to person, place, and time. He appears well-developed and well-nourished.       Awake, alert, nontoxic appearance.  HENT:  Head: Normocephalic and atraumatic.  Right Ear: External ear normal.  Left Ear: External ear normal.  Nose: Nose normal.  Mouth/Throat: Oropharynx is clear and moist.  Eyes: Conjunctivae and EOM are normal. Pupils are equal, round, and reactive to light. Right eye exhibits no discharge. Left eye exhibits no discharge.  Neck: Normal range of motion. Neck supple.  Cardiovascular:       tachycardia  Pulmonary/Chest: Effort normal and breath sounds normal. He exhibits no tenderness.  Abdominal: Soft. Bowel sounds are normal. There is no tenderness. There is no rebound.  Musculoskeletal: He exhibits no edema and no tenderness.       Baseline ROM, no obvious new focal weakness.  Neurological: He is alert and oriented to person, place, and time. He has normal reflexes.       Mental status and motor strength appears baseline for patient and situation.  Skin: Skin is warm and dry. No rash noted.  Psychiatric: He has a normal mood and affect.    ED Course  Procedures (including critical care time) Results for orders placed during the hospital encounter of 03/24/12  URINALYSIS, ROUTINE W REFLEX MICROSCOPIC  Component Value Range   Color, Urine YELLOW  YELLOW   APPearance CLEAR  CLEAR   Specific Gravity, Urine 1.020  1.005 - 1.030   pH 6.0  5.0 - 8.0   Glucose, UA NEGATIVE  NEGATIVE mg/dL   Hgb urine dipstick LARGE (*) NEGATIVE   Bilirubin Urine NEGATIVE  NEGATIVE   Ketones, ur NEGATIVE  NEGATIVE mg/dL   Protein, ur TRACE (*) NEGATIVE mg/dL   Urobilinogen, UA 1.0  0.0 - 1.0 mg/dL   Nitrite NEGATIVE  NEGATIVE   Leukocytes, UA NEGATIVE  NEGATIVE  CBC WITH DIFFERENTIAL      Component Value Range   WBC 11.4 (*) 4.0 - 10.5 K/uL   RBC 6.76 (*) 4.22 - 5.81 MIL/uL    Hemoglobin 15.8  13.0 - 17.0 g/dL   HCT 40.9  81.1 - 91.4 %   MCV 66.1 (*) 78.0 - 100.0 fL   MCH 23.4 (*) 26.0 - 34.0 pg   MCHC 35.3  30.0 - 36.0 g/dL   RDW 78.2  95.6 - 21.3 %   Platelets 242  150 - 400 K/uL   Neutrophils Relative 76  43 - 77 %   Neutro Abs 8.6 (*) 1.7 - 7.7 K/uL   Lymphocytes Relative 14  12 - 46 %   Lymphs Abs 1.6  0.7 - 4.0 K/uL   Monocytes Relative 10  3 - 12 %   Monocytes Absolute 1.1 (*) 0.1 - 1.0 K/uL   Eosinophils Relative 0  0 - 5 %   Eosinophils Absolute 0.0  0.0 - 0.7 K/uL   Basophils Relative 0  0 - 1 %   Basophils Absolute 0.0  0.0 - 0.1 K/uL  BASIC METABOLIC PANEL      Component Value Range   Sodium 136  135 - 145 mEq/L   Potassium 3.5  3.5 - 5.1 mEq/L   Chloride 102  96 - 112 mEq/L   CO2 22  19 - 32 mEq/L   Glucose, Bld 113 (*) 70 - 99 mg/dL   BUN 9  6 - 23 mg/dL   Creatinine, Ser 0.86  0.50 - 1.35 mg/dL   Calcium 9.4  8.4 - 57.8 mg/dL   GFR calc non Af Amer >90  >90 mL/min   GFR calc Af Amer >90  >90 mL/min  URINE MICROSCOPIC-ADD ON      Component Value Range   Squamous Epithelial / LPF RARE  RARE   WBC, UA 0-2  <3 WBC/hpf   RBC / HPF TOO NUMEROUS TO COUNT  <3 RBC/hpf   Bacteria, UA FEW (*) RARE    Dg Chest 2 View  03/24/2012  *RADIOLOGY REPORT*  Clinical Data: Fever, headache.  CHEST - 2 VIEW  Comparison: 05/06/2011  Findings: Lungs are clear. No pleural effusion or pneumothorax. The cardiomediastinal contours are within normal limits. The visualized bones and soft tissues are without significant appreciable abnormality.  IMPRESSION: No radiographic evidence of acute cardiopulmonary process.   Original Report Authenticated By: Waneta Martins, M.D.         MDM  Patient with fever, headache, body aches, nausea x 3 days. Given IVF, analgesic, antiinflammatory, antiemetic with improvement. Labs with mild leukocytosis. Chest xray normal.  Pt feels improved after observation and/or treatment in ED.Pt stable in ED with no significant  deterioration in condition.The patient appears reasonably screened and/or stabilized for discharge and I doubt any other medical condition or other [facility] requiring further screening, evaluation, or treatment in the ED at this  time prior to discharge.  MDM Reviewed: nursing note and vitals Interpretation: labs and x-ray           Nicoletta Dress. Colon Branch, MD 03/24/12 1191

## 2012-03-24 NOTE — ED Notes (Signed)
Headache, eyes burning per pt. Also legs hurting per pt. Nauseated per pt.

## 2012-10-25 ENCOUNTER — Encounter (HOSPITAL_COMMUNITY): Payer: Self-pay

## 2012-10-25 ENCOUNTER — Emergency Department (HOSPITAL_COMMUNITY)
Admission: EM | Admit: 2012-10-25 | Discharge: 2012-10-25 | Disposition: A | Discharge status: Home / Self Care | Payer: Self-pay | Attending: Emergency Medicine | Admitting: Emergency Medicine

## 2012-10-25 DIAGNOSIS — M549 Dorsalgia, unspecified: Secondary | ICD-10-CM | POA: Insufficient documentation

## 2012-10-25 DIAGNOSIS — L739 Follicular disorder, unspecified: Secondary | ICD-10-CM

## 2012-10-25 DIAGNOSIS — Z87828 Personal history of other (healed) physical injury and trauma: Secondary | ICD-10-CM | POA: Insufficient documentation

## 2012-10-25 DIAGNOSIS — L738 Other specified follicular disorders: Secondary | ICD-10-CM | POA: Insufficient documentation

## 2012-10-25 DIAGNOSIS — F17200 Nicotine dependence, unspecified, uncomplicated: Secondary | ICD-10-CM | POA: Insufficient documentation

## 2012-10-25 DIAGNOSIS — Z87448 Personal history of other diseases of urinary system: Secondary | ICD-10-CM | POA: Insufficient documentation

## 2012-10-25 HISTORY — DX: Accidental discharge from unspecified firearms or gun, initial encounter: W34.00XA

## 2012-10-25 HISTORY — DX: Puncture wound without foreign body, unspecified thigh, initial encounter: S71.139A

## 2012-10-25 MED ORDER — CEPHALEXIN 500 MG PO CAPS: 500.0000 mg | ORAL_CAPSULE | Freq: Four times a day (QID) | ORAL | Status: DC

## 2012-10-25 NOTE — ED Notes (Signed)
Pt reports "little bumps to back and sides of his head for last month", "hurts to pick it".

## 2012-10-25 NOTE — ED Notes (Signed)
Pt presents with multiple small nodules in various stages on scalp and hairline along the jaw line. Pt reports "spots are painful to touch and seem to fill-up causing increased pressure in sores.". Pt denies fever, N/V at this time.  Sores have scabs and some have serous fluid drainage at this time.  No acute distress.
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
## 2013-01-15 ENCOUNTER — Emergency Department (HOSPITAL_COMMUNITY): Payer: Self-pay

## 2013-01-15 ENCOUNTER — Emergency Department (HOSPITAL_COMMUNITY)
Admission: EM | Admit: 2013-01-15 | Discharge: 2013-01-15 | Disposition: A | Discharge status: Home / Self Care | Payer: Self-pay | Attending: Emergency Medicine | Admitting: Emergency Medicine

## 2013-01-15 ENCOUNTER — Encounter (HOSPITAL_COMMUNITY): Payer: Self-pay | Admitting: Emergency Medicine

## 2013-01-15 DIAGNOSIS — R3129 Other microscopic hematuria: Secondary | ICD-10-CM | POA: Insufficient documentation

## 2013-01-15 DIAGNOSIS — M545 Low back pain, unspecified: Secondary | ICD-10-CM | POA: Insufficient documentation

## 2013-01-15 DIAGNOSIS — R197 Diarrhea, unspecified: Secondary | ICD-10-CM | POA: Insufficient documentation

## 2013-01-15 DIAGNOSIS — R112 Nausea with vomiting, unspecified: Secondary | ICD-10-CM | POA: Insufficient documentation

## 2013-01-15 DIAGNOSIS — F17200 Nicotine dependence, unspecified, uncomplicated: Secondary | ICD-10-CM | POA: Insufficient documentation

## 2013-01-15 DIAGNOSIS — R61 Generalized hyperhidrosis: Secondary | ICD-10-CM | POA: Insufficient documentation

## 2013-01-15 DIAGNOSIS — R109 Unspecified abdominal pain: Secondary | ICD-10-CM | POA: Insufficient documentation

## 2013-01-15 DIAGNOSIS — Z87828 Personal history of other (healed) physical injury and trauma: Secondary | ICD-10-CM | POA: Insufficient documentation

## 2013-01-15 LAB — URINALYSIS, ROUTINE W REFLEX MICROSCOPIC
Glucose, UA: NEGATIVE mg/dL
Ketones, ur: NEGATIVE mg/dL
Leukocytes, UA: NEGATIVE
Nitrite: NEGATIVE
Protein, ur: NEGATIVE mg/dL
Urobilinogen, UA: 0.2 mg/dL (ref 0.0–1.0)

## 2013-01-15 LAB — COMPREHENSIVE METABOLIC PANEL
Albumin: 4.1 g/dL (ref 3.5–5.2)
Alkaline Phosphatase: 77 U/L (ref 39–117)
BUN: 13 mg/dL (ref 6–23)
Chloride: 101 mEq/L (ref 96–112)
Creatinine, Ser: 0.95 mg/dL (ref 0.50–1.35)
GFR calc Af Amer: >90 mL/min (ref >90)
Glucose, Bld: 109 mg/dL — ABNORMAL HIGH (ref 70–99)
Total Bilirubin: 0.4 mg/dL (ref 0.3–1.2)

## 2013-01-15 LAB — CBC WITH DIFFERENTIAL/PLATELET
Basophils Relative: 0 % (ref 0–1)
Eosinophils Absolute: 0.1 10*3/uL (ref 0.0–0.7)
HCT: 47.6 % (ref 39.0–52.0)
Hemoglobin: 16.3 g/dL (ref 13.0–17.0)
Lymphs Abs: 2.8 10*3/uL (ref 0.7–4.0)
MCH: 22.4 pg — ABNORMAL LOW (ref 26.0–34.0)
MCHC: 34.2 g/dL (ref 30.0–36.0)
Monocytes Absolute: 0.9 10*3/uL (ref 0.1–1.0)
Monocytes Relative: 8 % (ref 3–12)
RBC: 7.27 MIL/uL — ABNORMAL HIGH (ref 4.22–5.81)

## 2013-01-15 LAB — LIPASE, BLOOD: Lipase: 41 U/L (ref 11–59)

## 2013-01-15 MED ORDER — IOHEXOL 300 MG/ML  SOLN
100.0000 mL | Freq: Once | INTRAMUSCULAR | Status: AC | PRN
  Administered 2013-01-15: 100 mL via INTRAVENOUS

## 2013-01-15 MED ORDER — FENTANYL CITRATE 0.05 MG/ML IJ SOLN
50.0000 ug | Freq: Once | INTRAMUSCULAR | Status: AC
  Administered 2013-01-15: 50 ug via INTRAVENOUS
  Filled 2013-01-15: qty 2

## 2013-01-15 MED ORDER — IOHEXOL 300 MG/ML  SOLN
50.0000 mL | Freq: Once | INTRAMUSCULAR | Status: AC | PRN
  Administered 2013-01-15: 50 mL via ORAL

## 2013-01-15 MED ORDER — HYDROMORPHONE HCL PF 1 MG/ML IJ SOLN
1.0000 mg | Freq: Once | INTRAMUSCULAR | Status: AC
  Administered 2013-01-15: 1 mg via INTRAVENOUS
  Filled 2013-01-15: qty 1

## 2013-01-15 MED ORDER — PROMETHAZINE HCL 25 MG PO TABS: 25.0000 mg | ORAL_TABLET | Freq: Four times a day (QID) | ORAL | Status: DC | PRN

## 2013-01-15 MED ORDER — OXYCODONE-ACETAMINOPHEN 5-325 MG PO TABS: 2.0000 | ORAL_TABLET | ORAL | Status: DC | PRN

## 2013-01-15 MED ORDER — SODIUM CHLORIDE 0.9 % IV BOLUS (SEPSIS)
1000.0000 mL | Freq: Once | INTRAVENOUS | Status: AC
Start: 2013-01-15 — End: 2013-01-15
  Administered 2013-01-15: 1000 mL via INTRAVENOUS

## 2013-01-15 MED ORDER — SODIUM CHLORIDE 0.9 % IV BOLUS (SEPSIS)
1000.0000 mL | Freq: Once | INTRAVENOUS | Status: AC
  Administered 2013-01-15: 1000 mL via INTRAVENOUS

## 2013-01-15 MED ORDER — ONDANSETRON HCL 4 MG/2ML IJ SOLN
4.0000 mg | Freq: Once | INTRAMUSCULAR | Status: AC
  Administered 2013-01-15: 4 mg via INTRAVENOUS
  Filled 2013-01-15: qty 2

## 2013-01-15 NOTE — ED Notes (Signed)
Pt c/o lower abd pain/mid back pain with n/v/d x 3 days.
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
## 2014-04-02 ENCOUNTER — Encounter (HOSPITAL_COMMUNITY): Payer: Self-pay | Admitting: Emergency Medicine

## 2014-04-02 ENCOUNTER — Emergency Department (HOSPITAL_COMMUNITY)
Admission: EM | Admit: 2014-04-02 | Discharge: 2014-04-02 | Disposition: A | Discharge status: Home / Self Care | Payer: Self-pay | Attending: Emergency Medicine | Admitting: Emergency Medicine

## 2014-04-02 ENCOUNTER — Emergency Department (HOSPITAL_COMMUNITY): Payer: Self-pay

## 2014-04-02 DIAGNOSIS — R81 Glycosuria: Secondary | ICD-10-CM | POA: Insufficient documentation

## 2014-04-02 DIAGNOSIS — R109 Unspecified abdominal pain: Secondary | ICD-10-CM | POA: Insufficient documentation

## 2014-04-02 DIAGNOSIS — F17200 Nicotine dependence, unspecified, uncomplicated: Secondary | ICD-10-CM | POA: Insufficient documentation

## 2014-04-02 DIAGNOSIS — Z87448 Personal history of other diseases of urinary system: Secondary | ICD-10-CM | POA: Insufficient documentation

## 2014-04-02 DIAGNOSIS — Z87828 Personal history of other (healed) physical injury and trauma: Secondary | ICD-10-CM | POA: Insufficient documentation

## 2014-04-02 DIAGNOSIS — R1030 Lower abdominal pain, unspecified: Secondary | ICD-10-CM

## 2014-04-02 LAB — CBC WITH DIFFERENTIAL/PLATELET
Basophils Absolute: 0 10*3/uL (ref 0.0–0.1)
Basophils Relative: 0 % (ref 0–1)
Eosinophils Absolute: 0 10*3/uL (ref 0.0–0.7)
Eosinophils Relative: 0 % (ref 0–5)
HEMATOCRIT: 47.6 % (ref 39.0–52.0)
HEMOGLOBIN: 16.7 g/dL (ref 13.0–17.0)
LYMPHS PCT: 25 % (ref 12–46)
Lymphs Abs: 2.4 10*3/uL (ref 0.7–4.0)
MCH: 23.3 pg — ABNORMAL LOW (ref 26.0–34.0)
MCHC: 35.1 g/dL (ref 30.0–36.0)
MCV: 66.5 fL — ABNORMAL LOW (ref 78.0–100.0)
MONO ABS: 0.7 10*3/uL (ref 0.1–1.0)
MONOS PCT: 8 % (ref 3–12)
NEUTROS ABS: 6.5 10*3/uL (ref 1.7–7.7)
Neutrophils Relative %: 67 % (ref 43–77)
Platelets: 283 10*3/uL (ref 150–400)
RBC: 7.16 MIL/uL — ABNORMAL HIGH (ref 4.22–5.81)
RDW: 15.5 % (ref 11.5–15.5)
WBC: 9.7 10*3/uL (ref 4.0–10.5)

## 2014-04-02 LAB — URINALYSIS, ROUTINE W REFLEX MICROSCOPIC
BILIRUBIN URINE: NEGATIVE
Glucose, UA: >1000 mg/dL — AB
Leukocytes, UA: NEGATIVE
NITRITE: NEGATIVE
PH: 6 (ref 5.0–8.0)
Protein, ur: NEGATIVE mg/dL
SPECIFIC GRAVITY, URINE: 1.015 (ref 1.005–1.030)
Urobilinogen, UA: 0.2 mg/dL (ref 0.0–1.0)

## 2014-04-02 LAB — URINE MICROSCOPIC-ADD ON

## 2014-04-02 LAB — BASIC METABOLIC PANEL
ANION GAP: 13 (ref 5–15)
BUN: 13 mg/dL (ref 6–23)
CHLORIDE: 103 meq/L (ref 96–112)
CO2: 26 meq/L (ref 19–32)
CREATININE: 1.12 mg/dL (ref 0.50–1.35)
Calcium: 9.4 mg/dL (ref 8.4–10.5)
GFR calc Af Amer: >90 mL/min (ref >90)
GFR calc non Af Amer: 86 mL/min — ABNORMAL LOW (ref >90)
Glucose, Bld: 95 mg/dL (ref 70–99)
Potassium: 4.6 mEq/L (ref 3.7–5.3)
Sodium: 142 mEq/L (ref 137–147)

## 2014-04-02 LAB — CBG MONITORING, ED: GLUCOSE-CAPILLARY: 99 mg/dL (ref 70–99)

## 2014-04-02 MED ORDER — IOHEXOL 300 MG/ML  SOLN
100.0000 mL | Freq: Once | INTRAMUSCULAR | Status: AC | PRN
  Administered 2014-04-02: 100 mL via INTRAVENOUS

## 2014-04-02 MED ORDER — SODIUM CHLORIDE 0.9 % IJ SOLN
INTRAMUSCULAR | Status: AC
  Filled 2014-04-02: qty 600

## 2014-04-02 MED ORDER — SODIUM CHLORIDE 0.9 % IV BOLUS (SEPSIS)
1000.0000 mL | Freq: Once | INTRAVENOUS | Status: AC
  Administered 2014-04-02: 1000 mL via INTRAVENOUS

## 2014-04-02 MED ORDER — ONDANSETRON HCL 4 MG/2ML IJ SOLN
4.0000 mg | Freq: Once | INTRAMUSCULAR | Status: AC
  Administered 2014-04-02: 4 mg via INTRAVENOUS
  Filled 2014-04-02: qty 2

## 2014-04-02 MED ORDER — IOHEXOL 300 MG/ML  SOLN
50.0000 mL | Freq: Once | INTRAMUSCULAR | Status: AC | PRN
  Administered 2014-04-02: 50 mL via ORAL

## 2014-04-02 MED ORDER — MORPHINE SULFATE 4 MG/ML IJ SOLN
4.0000 mg | Freq: Once | INTRAMUSCULAR | Status: AC
  Administered 2014-04-02: 4 mg via INTRAVENOUS
  Filled 2014-04-02: qty 1

## 2014-04-02 NOTE — ED Notes (Signed)
Patient waiting quietly without any complaints 

## 2014-04-02 NOTE — ED Notes (Signed)
Patient waiting without any complaints will continue to monitor 

## 2014-04-02 NOTE — ED Notes (Signed)
MD at the bedside  

## 2014-04-02 NOTE — Discharge Instructions (Signed)
Abdominal Pain Follow up wi th your doctor regarding the glucose in your urine. Return to the ED if you develop new or worsening symptoms. Many things can cause abdominal pain. Usually, abdominal pain is not caused by a disease and will improve without treatment. It can often be observed and treated at home. Your health care provider will do a physical exam and possibly order blood tests and X-rays to help determine the seriousness of your pain. However, in many cases, more time must pass before a clear cause of the pain can be found. Before that point, your health care provider may not know if you need more testing or further treatment. HOME CARE INSTRUCTIONS  Monitor your abdominal pain for any changes. The following actions may help to alleviate any discomfort you are experiencing:  Only take over-the-counter or prescription medicines as directed by your health care provider.  Do not take laxatives unless directed to do so by your health care provider.  Try a clear liquid diet (broth, tea, or water) as directed by your health care provider. Slowly move to a bland diet as tolerated. SEEK MEDICAL CARE IF:  You have unexplained abdominal pain.  You have abdominal pain associated with nausea or diarrhea.  You have pain when you urinate or have a bowel movement.  You experience abdominal pain that wakes you in the night.  You have abdominal pain that is worsened or improved by eating food.  You have abdominal pain that is worsened with eating fatty foods.  You have a fever. SEEK IMMEDIATE MEDICAL CARE IF:   Your pain does not go away within 2 hours.  You keep throwing up (vomiting).  Your pain is felt only in portions of the abdomen, such as the right side or the left lower portion of the abdomen.  You pass bloody or black tarry stools. MAKE SURE YOU:  Understand these instructions.   Will watch your condition.   Will get help right away if you are not doing well or get  worse.  Document Released: 04/27/2005 Document Revised: 07/23/2013 Document Reviewed: 03/27/2013 [facility] Patient Information 2015 [place], [place]. This information is not intended to replace advice given to you by your health care provider. Make sure you discuss any questions you have with your health care provider.    Glucose, Urine This test measures the level of sugar (glucose) in your urine. Glucose in your urine suggests diabetes or blood glucose level problems. Normally when blood passes through the kidneys, the glucose is reabsorbed. When the blood glucose level is too high, the glucose will then pass into the urine. This is not a sensitive test for diabetes and is not used as much anymore. PREPARATION FOR TEST No preparation or fasting is necessary. NORMAL FINDINGS  Random specimen: Negative  24-hour specimen: Less than 0.5 g/day or less than 2.78 mmol/day Ranges for normal findings may vary among different laboratories and hospitals. You should always check with your doctor after having lab work or other tests done to discuss the meaning of your test results and whether your values are considered within normal limits. MEANING OF TEST  Your caregiver will go over the test results with you and discuss the importance and meaning of your results, as well as treatment options and the need for additional tests. OBTAINING THE TEST RESULTS It is your responsibility to obtain your test results. Ask the lab or department performing the test when and how you will get your results. Document Released: 08/10/2004 Document Revised:  10/10/2011 Document Reviewed: 06/28/2008 [employer] Patient Information 2015 [place], [place]. This information is not intended to replace advice given to you by your health care provider. Make sure you discuss any questions you have with your health care provider.

## 2014-04-02 NOTE — ED Notes (Signed)
RLQ pain x 2 days with nausea.  Denies v/d.

## 2014-04-02 NOTE — ED Notes (Signed)
Patient given discharge instruction, verbalized understand. IV removed, band aid applied. Patient ambulatory out of the department.  
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
## 2019-05-09 ENCOUNTER — Other Ambulatory Visit: Payer: Self-pay

## 2019-05-09 DIAGNOSIS — Z20822 Contact with and (suspected) exposure to covid-19: Secondary | ICD-10-CM

## 2019-05-11 LAB — NOVEL CORONAVIRUS, NAA: SARS-CoV-2, NAA: NOT DETECTED

## 2019-09-15 ENCOUNTER — Emergency Department (HOSPITAL_COMMUNITY)
Admission: EM | Admit: 2019-09-15 | Discharge: 2019-09-15 | Disposition: A | Discharge status: Home / Self Care | Payer: Self-pay | Attending: Emergency Medicine | Admitting: Emergency Medicine

## 2019-09-15 ENCOUNTER — Encounter (HOSPITAL_COMMUNITY): Payer: Self-pay | Admitting: Emergency Medicine

## 2019-09-15 ENCOUNTER — Emergency Department (HOSPITAL_COMMUNITY): Payer: Self-pay

## 2019-09-15 ENCOUNTER — Other Ambulatory Visit: Payer: Self-pay

## 2019-09-15 DIAGNOSIS — F17210 Nicotine dependence, cigarettes, uncomplicated: Secondary | ICD-10-CM | POA: Insufficient documentation

## 2019-09-15 DIAGNOSIS — N200 Calculus of kidney: Secondary | ICD-10-CM | POA: Insufficient documentation

## 2019-09-15 DIAGNOSIS — R109 Unspecified abdominal pain: Secondary | ICD-10-CM

## 2019-09-15 LAB — CBC WITH DIFFERENTIAL/PLATELET
Abs Immature Granulocytes: 0.03 10*3/uL (ref 0.00–0.07)
Basophils Absolute: 0 10*3/uL (ref 0.0–0.1)
Basophils Relative: 1 %
Eosinophils Absolute: 0 10*3/uL (ref 0.0–0.5)
Eosinophils Relative: 1 %
HCT: 57.2 % — ABNORMAL HIGH (ref 39.0–52.0)
Hemoglobin: 18.6 g/dL — ABNORMAL HIGH (ref 13.0–17.0)
Immature Granulocytes: 0 %
Lymphocytes Relative: 32 %
Lymphs Abs: 2.6 10*3/uL (ref 0.7–4.0)
MCH: 22.2 pg — ABNORMAL LOW (ref 26.0–34.0)
MCHC: 32.5 g/dL (ref 30.0–36.0)
MCV: 68.3 fL — ABNORMAL LOW (ref 80.0–100.0)
Monocytes Absolute: 0.7 10*3/uL (ref 0.1–1.0)
Monocytes Relative: 9 %
Neutro Abs: 4.6 10*3/uL (ref 1.7–7.7)
Neutrophils Relative %: 57 %
Platelets: 335 10*3/uL (ref 150–400)
RBC: 8.37 MIL/uL — ABNORMAL HIGH (ref 4.22–5.81)
RDW: 18.5 % — ABNORMAL HIGH (ref 11.5–15.5)
WBC: 8.1 10*3/uL (ref 4.0–10.5)
nRBC: 0 % (ref 0.0–0.2)

## 2019-09-15 LAB — URINALYSIS, ROUTINE W REFLEX MICROSCOPIC
Bacteria, UA: NONE SEEN
Bilirubin Urine: NEGATIVE
Glucose, UA: NEGATIVE mg/dL
Ketones, ur: NEGATIVE mg/dL
Leukocytes,Ua: NEGATIVE
Nitrite: NEGATIVE
Protein, ur: 30 mg/dL — AB
RBC / HPF: >50 RBC/hpf — ABNORMAL HIGH (ref 0–5)
Specific Gravity, Urine: 1.024 (ref 1.005–1.030)
pH: 6 (ref 5.0–8.0)

## 2019-09-15 LAB — COMPREHENSIVE METABOLIC PANEL
ALT: 51 U/L — ABNORMAL HIGH (ref 0–44)
AST: 27 U/L (ref 15–41)
Albumin: 4.9 g/dL (ref 3.5–5.0)
Alkaline Phosphatase: 68 U/L (ref 38–126)
Anion gap: 9 (ref 5–15)
BUN: 12 mg/dL (ref 6–20)
CO2: 26 mmol/L (ref 22–32)
Calcium: 9.3 mg/dL (ref 8.9–10.3)
Chloride: 104 mmol/L (ref 98–111)
Creatinine, Ser: 0.98 mg/dL (ref 0.61–1.24)
GFR calc Af Amer: >60 mL/min (ref >60)
GFR calc non Af Amer: >60 mL/min (ref >60)
Glucose, Bld: 104 mg/dL — ABNORMAL HIGH (ref 70–99)
Potassium: 3.7 mmol/L (ref 3.5–5.1)
Sodium: 139 mmol/L (ref 135–145)
Total Bilirubin: 0.7 mg/dL (ref 0.3–1.2)
Total Protein: 8.4 g/dL — ABNORMAL HIGH (ref 6.5–8.1)

## 2019-09-15 MED ORDER — ONDANSETRON HCL 4 MG/2ML IJ SOLN
4.0000 mg | Freq: Once | INTRAMUSCULAR | Status: AC
  Administered 2019-09-15: 20:00:00 4 mg via INTRAVENOUS
  Filled 2019-09-15: qty 2

## 2019-09-15 MED ORDER — ONDANSETRON 4 MG PO TBDP: 4.0000 mg | ORAL_TABLET | Freq: Three times a day (TID) | ORAL | 0 refills | Status: DC | PRN

## 2019-09-15 MED ORDER — NAPROXEN 500 MG PO TABS: 500.0000 mg | ORAL_TABLET | Freq: Two times a day (BID) | ORAL | 0 refills | Status: DC

## 2019-09-15 MED ORDER — MORPHINE SULFATE (PF) 4 MG/ML IV SOLN
4.0000 mg | Freq: Once | INTRAVENOUS | Status: AC
  Administered 2019-09-15: 20:00:00 4 mg via INTRAVENOUS
  Filled 2019-09-15: qty 1

## 2019-09-15 MED ORDER — SODIUM CHLORIDE 0.9 % IV BOLUS
1000.0000 mL | Freq: Once | INTRAVENOUS | Status: AC
  Administered 2019-09-15: 20:00:00 1000 mL via INTRAVENOUS

## 2019-09-15 MED ORDER — KETOROLAC TROMETHAMINE 30 MG/ML IJ SOLN
15.0000 mg | Freq: Once | INTRAMUSCULAR | Status: AC
  Administered 2019-09-15: 15 mg via INTRAVENOUS
  Filled 2019-09-15: qty 1

## 2019-09-15 NOTE — Discharge Instructions (Signed)
You were seen in the emergency department today for back and abdominal pain.  Your work-up was overall reassuring.  Your urine did have blood in it but no signs of infection.  Your CT scan showed that you have a 6 mm stone within your right kidney.  It is possible that this was contributing to your discomfort.  For this reason we would like you to follow-up with a urologist within the next few days.  We are sending home with the following medicines to help with your symptoms:  - Naproxen is a nonsteroidal anti-inflammatory medication that will help with pain and swelling. Be sure to take this medication as prescribed with food, 1 pill every 12 hours,  It should be taken with food, as it can cause stomach upset, and more seriously, stomach bleeding. Do not take other nonsteroidal anti-inflammatory medications with this such as Advil, Motrin, Aleve, Mobic, Goodie Powder, or Motrin.    - Zofran-to help with nausea and vomiting, please take every 8 hours as needed.  You make take Tylenol per over the counter dosing with these medications.   We have prescribed you new medication(s) today. Discuss the medications prescribed today with your pharmacist as they can have adverse effects and interactions with your other medicines including over the counter and prescribed medications. Seek medical evaluation if you start to experience new or abnormal symptoms after taking one of these medicines, seek care immediately if you start to experience difficulty breathing, feeling of your throat closing, facial swelling, or rash as these could be indications of a more serious allergic reaction Return to the ER for new or worsening symptoms including but not limited to increased pain, inability to keep fluids down, fever, passing out, chest pain, trouble breathing, or any other concerns.
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
## 2019-09-15 NOTE — ED Triage Notes (Signed)
Pt C/O bilateral flank pain and nausea that radiates to his abdomen that started at 0545 this morning. Denies blood in urine.

## 2019-09-15 NOTE — ED Notes (Signed)
Patient to CT.

## 2019-09-16 ENCOUNTER — Encounter (HOSPITAL_COMMUNITY): Payer: Self-pay | Admitting: *Deleted

## 2019-09-16 ENCOUNTER — Emergency Department (HOSPITAL_COMMUNITY): Payer: Self-pay

## 2019-09-16 ENCOUNTER — Emergency Department (HOSPITAL_COMMUNITY)
Admission: EM | Admit: 2019-09-16 | Discharge: 2019-09-16 | Disposition: A | Discharge status: Home / Self Care | Payer: Self-pay | Attending: Emergency Medicine | Admitting: Emergency Medicine

## 2019-09-16 DIAGNOSIS — F1210 Cannabis abuse, uncomplicated: Secondary | ICD-10-CM | POA: Insufficient documentation

## 2019-09-16 DIAGNOSIS — F17210 Nicotine dependence, cigarettes, uncomplicated: Secondary | ICD-10-CM | POA: Insufficient documentation

## 2019-09-16 DIAGNOSIS — Z79899 Other long term (current) drug therapy: Secondary | ICD-10-CM | POA: Insufficient documentation

## 2019-09-16 DIAGNOSIS — N200 Calculus of kidney: Secondary | ICD-10-CM | POA: Insufficient documentation

## 2019-09-16 MED ORDER — KETOROLAC TROMETHAMINE 30 MG/ML IJ SOLN
30.0000 mg | Freq: Once | INTRAMUSCULAR | Status: AC
  Administered 2019-09-16: 17:00:00 30 mg via INTRAMUSCULAR
  Filled 2019-09-16: qty 1

## 2019-09-16 MED ORDER — MORPHINE SULFATE (PF) 4 MG/ML IV SOLN
4.0000 mg | Freq: Once | INTRAVENOUS | Status: AC
  Administered 2019-09-16: 17:00:00 4 mg via INTRAMUSCULAR
  Filled 2019-09-16: qty 1

## 2019-09-16 MED ORDER — ONDANSETRON 4 MG PO TBDP
4.0000 mg | ORAL_TABLET | Freq: Once | ORAL | Status: AC
  Administered 2019-09-16: 4 mg via ORAL
  Filled 2019-09-16: qty 1

## 2019-09-16 MED ORDER — OXYCODONE-ACETAMINOPHEN 5-325 MG PO TABS: 1.0000 | ORAL_TABLET | ORAL | 0 refills | Status: DC | PRN

## 2019-09-16 NOTE — ED Triage Notes (Signed)
Seen yesterday and diagnosed with a kidney stone, here today for pain control

## 2019-09-16 NOTE — Discharge Instructions (Addendum)
You may take the oxycodone prescribed for pain relief.  This will make you drowsy - do not drive within 4 hours of taking this medication.  Strain your urine and save the stone if it passes. Call [facility] for follow up care.
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
## 2019-10-04 ENCOUNTER — Ambulatory Visit: Payer: Self-pay | Admitting: Urology

## 2019-12-26 ENCOUNTER — Emergency Department (HOSPITAL_COMMUNITY): Payer: No Typology Code available for payment source

## 2019-12-26 ENCOUNTER — Encounter (HOSPITAL_COMMUNITY): Payer: Self-pay | Admitting: Emergency Medicine

## 2019-12-26 ENCOUNTER — Emergency Department (HOSPITAL_COMMUNITY)
Admission: EM | Admit: 2019-12-26 | Discharge: 2019-12-26 | Disposition: A | Discharge status: Home / Self Care | Payer: No Typology Code available for payment source | Attending: Emergency Medicine | Admitting: Emergency Medicine

## 2019-12-26 ENCOUNTER — Other Ambulatory Visit: Payer: Self-pay

## 2019-12-26 DIAGNOSIS — M791 Myalgia, unspecified site: Secondary | ICD-10-CM | POA: Diagnosis not present

## 2019-12-26 DIAGNOSIS — Z79899 Other long term (current) drug therapy: Secondary | ICD-10-CM | POA: Insufficient documentation

## 2019-12-26 DIAGNOSIS — R519 Headache, unspecified: Secondary | ICD-10-CM | POA: Diagnosis not present

## 2019-12-26 DIAGNOSIS — R0789 Other chest pain: Secondary | ICD-10-CM | POA: Diagnosis not present

## 2019-12-26 DIAGNOSIS — F17210 Nicotine dependence, cigarettes, uncomplicated: Secondary | ICD-10-CM | POA: Insufficient documentation

## 2019-12-26 MED ORDER — CYCLOBENZAPRINE HCL 10 MG PO TABS
10.0000 mg | ORAL_TABLET | Freq: Once | ORAL | Status: AC
  Administered 2019-12-26: 10 mg via ORAL
  Filled 2019-12-26: qty 1

## 2019-12-26 MED ORDER — ACETAMINOPHEN 500 MG PO TABS: 500.0000 mg | ORAL_TABLET | Freq: Four times a day (QID) | ORAL | 0 refills | Status: DC | PRN

## 2019-12-26 MED ORDER — NAPROXEN 250 MG PO TABS
500.0000 mg | ORAL_TABLET | Freq: Once | ORAL | Status: AC
  Administered 2019-12-26: 500 mg via ORAL
  Filled 2019-12-26: qty 2

## 2019-12-26 MED ORDER — CYCLOBENZAPRINE HCL 10 MG PO TABS: 10.0000 mg | ORAL_TABLET | Freq: Two times a day (BID) | ORAL | 0 refills | Status: DC | PRN

## 2019-12-26 MED ORDER — IBUPROFEN 600 MG PO TABS: 600.0000 mg | ORAL_TABLET | Freq: Four times a day (QID) | ORAL | 0 refills | Status: DC | PRN

## 2019-12-26 NOTE — ED Notes (Signed)
[place] signature pad not available.
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
## 2019-12-26 NOTE — ED Notes (Signed)
Pt ambulatory to waiting room. Pt verbalized understanding of discharge instructions.   

## 2019-12-26 NOTE — Discharge Instructions (Signed)
Alternate 600 mg of ibuprofen and [id number] mg of Tylenol every 3-4 hours as needed for pain.  Take ibuprofen with food to avoid upset stomach issues.  Do not exceed 4000 mg of Tylenol daily.  You can apply lidocaine cream to areas of pain as prescribed.  You may take Flexeril up to twice daily as needed for muscle spasms. This medication may make you drowsy, so I typically only recommended only taking at night. If this medication makes you drowsy throughout the day, no driving, drinking alcohol, or operating heavy machinery. You may also cut these tablets in half if they are too strong.   Apply ice or heat, whichever feels best, to areas of soreness 20 minutes at a time 3-4 times daily.  Do some gentle stretching throughout the day, especially during or after hot showers or baths. Take short frequent walks and avoid prolonged periods of sitting or laying.   Expect to be sore for the next few day and follow up with primary care physician for recheck of ongoing symptoms (especially if symptoms persist longer than 1 week) but return to ER for emergent changing or worsening of symptoms such as severe headache that gets worse, altered mental status/behaving unusually, persistent vomiting, excessive drowsiness, numbness to the arms or legs, unsteady gait, or slurred speech.
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
## 2020-06-14 ENCOUNTER — Other Ambulatory Visit: Payer: Self-pay

## 2020-06-14 ENCOUNTER — Encounter (HOSPITAL_COMMUNITY): Payer: Self-pay

## 2020-06-14 ENCOUNTER — Emergency Department (HOSPITAL_COMMUNITY): Payer: Self-pay

## 2020-06-14 ENCOUNTER — Emergency Department (HOSPITAL_COMMUNITY)
Admission: EM | Admit: 2020-06-14 | Discharge: 2020-06-14 | Disposition: A | Discharge status: Home / Self Care | Payer: Self-pay | Attending: Emergency Medicine | Admitting: Emergency Medicine

## 2020-06-14 DIAGNOSIS — N201 Calculus of ureter: Secondary | ICD-10-CM | POA: Insufficient documentation

## 2020-06-14 DIAGNOSIS — F17210 Nicotine dependence, cigarettes, uncomplicated: Secondary | ICD-10-CM | POA: Insufficient documentation

## 2020-06-14 DIAGNOSIS — M25572 Pain in left ankle and joints of left foot: Secondary | ICD-10-CM | POA: Insufficient documentation

## 2020-06-14 LAB — CBC WITH DIFFERENTIAL/PLATELET
Abs Immature Granulocytes: 0.05 10*3/uL (ref 0.00–0.07)
Basophils Absolute: 0.1 10*3/uL (ref 0.0–0.1)
Basophils Relative: 1 %
Eosinophils Absolute: 0.1 10*3/uL (ref 0.0–0.5)
Eosinophils Relative: 1 %
HCT: 49.6 % (ref 39.0–52.0)
Hemoglobin: 16.1 g/dL (ref 13.0–17.0)
Immature Granulocytes: 1 %
Lymphocytes Relative: 36 %
Lymphs Abs: 3.9 10*3/uL (ref 0.7–4.0)
MCH: 22.2 pg — ABNORMAL LOW (ref 26.0–34.0)
MCHC: 32.5 g/dL (ref 30.0–36.0)
MCV: 68.4 fL — ABNORMAL LOW (ref 80.0–100.0)
Monocytes Absolute: 1.1 10*3/uL — ABNORMAL HIGH (ref 0.1–1.0)
Monocytes Relative: 10 %
Neutro Abs: 5.8 10*3/uL (ref 1.7–7.7)
Neutrophils Relative %: 51 %
Platelets: 389 10*3/uL (ref 150–400)
RBC: 7.25 MIL/uL — ABNORMAL HIGH (ref 4.22–5.81)
RDW: 17.8 % — ABNORMAL HIGH (ref 11.5–15.5)
WBC: 11.1 10*3/uL — ABNORMAL HIGH (ref 4.0–10.5)
nRBC: 0 % (ref 0.0–0.2)

## 2020-06-14 LAB — URINALYSIS, ROUTINE W REFLEX MICROSCOPIC
Bacteria, UA: NONE SEEN
Bilirubin Urine: NEGATIVE
Glucose, UA: NEGATIVE mg/dL
Ketones, ur: NEGATIVE mg/dL
Leukocytes,Ua: NEGATIVE
Nitrite: NEGATIVE
Protein, ur: 100 mg/dL — AB
RBC / HPF: >50 RBC/hpf — ABNORMAL HIGH (ref 0–5)
Specific Gravity, Urine: 1.028 (ref 1.005–1.030)
pH: 5 (ref 5.0–8.0)

## 2020-06-14 LAB — LIPASE, BLOOD: Lipase: 29 U/L (ref 11–51)

## 2020-06-14 LAB — COMPREHENSIVE METABOLIC PANEL
ALT: 28 U/L (ref 0–44)
AST: 19 U/L (ref 15–41)
Albumin: 4.2 g/dL (ref 3.5–5.0)
Alkaline Phosphatase: 60 U/L (ref 38–126)
Anion gap: 10 (ref 5–15)
BUN: 12 mg/dL (ref 6–20)
CO2: 25 mmol/L (ref 22–32)
Calcium: 9.4 mg/dL (ref 8.9–10.3)
Chloride: 104 mmol/L (ref 98–111)
Creatinine, Ser: 1.04 mg/dL (ref 0.61–1.24)
GFR, Estimated: >60 mL/min (ref >60)
Glucose, Bld: 99 mg/dL (ref 70–99)
Potassium: 3.8 mmol/L (ref 3.5–5.1)
Sodium: 139 mmol/L (ref 135–145)
Total Bilirubin: 0.6 mg/dL (ref 0.3–1.2)
Total Protein: 7.4 g/dL (ref 6.5–8.1)

## 2020-06-14 MED ORDER — TAMSULOSIN HCL 0.4 MG PO CAPS: 0.4000 mg | ORAL_CAPSULE | Freq: Every day | ORAL | 0 refills | Status: AC

## 2020-06-14 MED ORDER — SENNOSIDES-DOCUSATE SODIUM 8.6-50 MG PO TABS: 1.0000 | ORAL_TABLET | Freq: Every evening | ORAL | 0 refills | Status: DC | PRN

## 2020-06-14 MED ORDER — OXYCODONE-ACETAMINOPHEN 5-325 MG PO TABS
1.0000 | ORAL_TABLET | Freq: Four times a day (QID) | ORAL | 0 refills | Status: DC | PRN
Start: 2020-06-14 — End: 2020-11-19

## 2020-06-14 MED ORDER — OXYCODONE-ACETAMINOPHEN 5-325 MG PO TABS
1.0000 | ORAL_TABLET | Freq: Once | ORAL | Status: AC
  Administered 2020-06-14: 1 via ORAL
  Filled 2020-06-14: qty 1

## 2020-06-14 MED ORDER — IBUPROFEN 800 MG PO TABS: 800.0000 mg | ORAL_TABLET | Freq: Three times a day (TID) | ORAL | 0 refills | Status: DC | PRN

## 2020-06-14 MED ORDER — KETOROLAC TROMETHAMINE 30 MG/ML IJ SOLN
30.0000 mg | Freq: Once | INTRAMUSCULAR | Status: AC
  Administered 2020-06-14: 30 mg via INTRAVENOUS
  Filled 2020-06-14: qty 1
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
## 2020-06-14 NOTE — ED Triage Notes (Signed)
Pt to er room number 12 via wc, pt states that he is here for R flank pain, states that it was a gradual onset today radiating around to the front, states that he had similar episode a couple of months ago and they thought that it might have been a kidney stone.

## 2020-06-14 NOTE — Discharge Instructions (Addendum)

## 2020-06-16 LAB — URINE CULTURE: Culture: <10000 — AB

## 2020-11-19 ENCOUNTER — Encounter (HOSPITAL_COMMUNITY): Payer: Self-pay | Admitting: Emergency Medicine

## 2020-11-19 ENCOUNTER — Emergency Department (HOSPITAL_COMMUNITY): Payer: Self-pay

## 2020-11-19 ENCOUNTER — Emergency Department (HOSPITAL_COMMUNITY)
Admission: EM | Admit: 2020-11-19 | Discharge: 2020-11-19 | Disposition: A | Discharge status: Home / Self Care | Payer: Self-pay | Attending: Emergency Medicine | Admitting: Emergency Medicine

## 2020-11-19 ENCOUNTER — Other Ambulatory Visit: Payer: Self-pay

## 2020-11-19 DIAGNOSIS — R911 Solitary pulmonary nodule: Secondary | ICD-10-CM | POA: Insufficient documentation

## 2020-11-19 DIAGNOSIS — F17210 Nicotine dependence, cigarettes, uncomplicated: Secondary | ICD-10-CM | POA: Insufficient documentation

## 2020-11-19 DIAGNOSIS — N201 Calculus of ureter: Secondary | ICD-10-CM | POA: Insufficient documentation

## 2020-11-19 DIAGNOSIS — R918 Other nonspecific abnormal finding of lung field: Secondary | ICD-10-CM

## 2020-11-19 LAB — COMPREHENSIVE METABOLIC PANEL
ALT: 22 U/L (ref 0–44)
AST: 17 U/L (ref 15–41)
Albumin: 4.5 g/dL (ref 3.5–5.0)
Alkaline Phosphatase: 62 U/L (ref 38–126)
Anion gap: 13 (ref 5–15)
BUN: 17 mg/dL (ref 6–20)
CO2: 21 mmol/L — ABNORMAL LOW (ref 22–32)
Calcium: 9.4 mg/dL (ref 8.9–10.3)
Chloride: 103 mmol/L (ref 98–111)
Creatinine, Ser: 1.35 mg/dL — ABNORMAL HIGH (ref 0.61–1.24)
GFR, Estimated: >60 mL/min (ref >60)
Glucose, Bld: 153 mg/dL — ABNORMAL HIGH (ref 70–99)
Potassium: 3.5 mmol/L (ref 3.5–5.1)
Sodium: 137 mmol/L (ref 135–145)
Total Bilirubin: 0.9 mg/dL (ref 0.3–1.2)
Total Protein: 7.8 g/dL (ref 6.5–8.1)

## 2020-11-19 LAB — CBC WITH DIFFERENTIAL/PLATELET
Abs Immature Granulocytes: 0.08 10*3/uL — ABNORMAL HIGH (ref 0.00–0.07)
Basophils Absolute: 0.1 10*3/uL (ref 0.0–0.1)
Basophils Relative: 1 %
Eosinophils Absolute: 0.1 10*3/uL (ref 0.0–0.5)
Eosinophils Relative: 0 %
HCT: 50.7 % (ref 39.0–52.0)
Hemoglobin: 16.7 g/dL (ref 13.0–17.0)
Immature Granulocytes: 1 %
Lymphocytes Relative: 22 %
Lymphs Abs: 3.5 10*3/uL (ref 0.7–4.0)
MCH: 22.2 pg — ABNORMAL LOW (ref 26.0–34.0)
MCHC: 32.9 g/dL (ref 30.0–36.0)
MCV: 67.4 fL — ABNORMAL LOW (ref 80.0–100.0)
Monocytes Absolute: 1.6 10*3/uL — ABNORMAL HIGH (ref 0.1–1.0)
Monocytes Relative: 10 %
Neutro Abs: 10.2 10*3/uL — ABNORMAL HIGH (ref 1.7–7.7)
Neutrophils Relative %: 66 %
Platelets: 422 10*3/uL — ABNORMAL HIGH (ref 150–400)
RBC: 7.52 MIL/uL — ABNORMAL HIGH (ref 4.22–5.81)
RDW: 18.1 % — ABNORMAL HIGH (ref 11.5–15.5)
WBC: 15.5 10*3/uL — ABNORMAL HIGH (ref 4.0–10.5)
nRBC: 0 % (ref 0.0–0.2)

## 2020-11-19 LAB — URINALYSIS, ROUTINE W REFLEX MICROSCOPIC
Bilirubin Urine: NEGATIVE
Glucose, UA: NEGATIVE mg/dL
Ketones, ur: NEGATIVE mg/dL
Nitrite: NEGATIVE
Protein, ur: 100 mg/dL — AB
RBC / HPF: >50 RBC/hpf — ABNORMAL HIGH (ref 0–5)
Specific Gravity, Urine: 1.026 (ref 1.005–1.030)
WBC, UA: >50 WBC/hpf — ABNORMAL HIGH (ref 0–5)
pH: 6 (ref 5.0–8.0)

## 2020-11-19 LAB — LIPASE, BLOOD: Lipase: 34 U/L (ref 11–51)

## 2020-11-19 MED ORDER — ONDANSETRON HCL 4 MG/2ML IJ SOLN
4.0000 mg | Freq: Once | INTRAMUSCULAR | Status: AC
  Administered 2020-11-19: 4 mg via INTRAVENOUS
  Filled 2020-11-19: qty 2

## 2020-11-19 MED ORDER — TAMSULOSIN HCL 0.4 MG PO CAPS: 0.4000 mg | ORAL_CAPSULE | Freq: Every day | ORAL | 0 refills | Status: AC

## 2020-11-19 MED ORDER — OXYCODONE-ACETAMINOPHEN 5-325 MG PO TABS
1.0000 | ORAL_TABLET | Freq: Four times a day (QID) | ORAL | 0 refills | Status: DC | PRN
Start: 2020-11-19 — End: 2020-12-07

## 2020-11-19 MED ORDER — IBUPROFEN 800 MG PO TABS: 800.0000 mg | ORAL_TABLET | Freq: Three times a day (TID) | ORAL | 0 refills | Status: DC | PRN

## 2020-11-19 MED ORDER — KETOROLAC TROMETHAMINE 30 MG/ML IJ SOLN
30.0000 mg | Freq: Once | INTRAMUSCULAR | Status: AC
  Administered 2020-11-19: 30 mg via INTRAVENOUS
  Filled 2020-11-19: qty 1

## 2020-11-19 NOTE — Discharge Instructions (Signed)
You were seen in the ED today with flank and abdominal pain. You will need to call the Urology team listed to schedule a follow up appointment. Return to the ED with any fever, uncontrollable pain, or other sudden/severe symptoms.   There are some lung nodules seen on your CT scan today. These do not appear serious but please discuss with your Primary Care doctor in the coming week. They may want to repeat a CT scan to check on these in the next 6 months.

## 2020-11-19 NOTE — ED Notes (Signed)
Pt c/o right flank pain since last night but has gotten worse. He states the pain is kind of like when he had kidney stones in past,but worse at this point. Pt also c/o nausea.
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
## 2020-11-19 NOTE — ED Triage Notes (Signed)
Pt c/o right flank pain that got bad tonight. Pt states he has a hx of kidney stones.

## 2020-11-20 ENCOUNTER — Ambulatory Visit: Payer: Self-pay | Admitting: Urology

## 2020-11-20 LAB — URINE CULTURE: Culture: <10000 — AB

## 2020-12-07 ENCOUNTER — Ambulatory Visit (INDEPENDENT_AMBULATORY_CARE_PROVIDER_SITE_OTHER): Payer: Self-pay | Admitting: Urology

## 2020-12-07 ENCOUNTER — Ambulatory Visit (HOSPITAL_COMMUNITY)
Admission: RE | Admit: 2020-12-07 | Discharge: 2020-12-07 | Disposition: A | Discharge status: Home / Self Care | Payer: Self-pay | Source: Ambulatory Visit | Attending: Urology | Admitting: Urology

## 2020-12-07 ENCOUNTER — Encounter: Payer: Self-pay | Admitting: Urology

## 2020-12-07 ENCOUNTER — Other Ambulatory Visit: Payer: Self-pay

## 2020-12-07 VITALS — BP 109/70 | HR 78 | Temp 98.2°F | Ht 67.0 in | Wt 204.0 lb

## 2020-12-07 DIAGNOSIS — N200 Calculus of kidney: Secondary | ICD-10-CM

## 2020-12-07 LAB — URINALYSIS, ROUTINE W REFLEX MICROSCOPIC
Bilirubin, UA: NEGATIVE
Glucose, UA: NEGATIVE
Ketones, UA: NEGATIVE
Nitrite, UA: NEGATIVE
Protein,UA: NEGATIVE
Specific Gravity, UA: 1.02 (ref 1.005–1.030)
Urobilinogen, Ur: 0.2 mg/dL (ref 0.2–1.0)
pH, UA: 6 (ref 5.0–7.5)

## 2020-12-07 LAB — MICROSCOPIC EXAMINATION
Bacteria, UA: NONE SEEN
RBC, Urine: >30 /hpf — AB (ref 0–2)
Renal Epithel, UA: NONE SEEN /hpf

## 2020-12-07 MED ORDER — OXYCODONE-ACETAMINOPHEN 5-325 MG PO TABS: 1.0000 | ORAL_TABLET | Freq: Four times a day (QID) | ORAL | 0 refills | Status: DC | PRN

## 2020-12-07 MED ORDER — TAMSULOSIN HCL 0.4 MG PO CAPS: 0.4000 mg | ORAL_CAPSULE | Freq: Every day | ORAL | 0 refills | Status: DC
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
## 2020-12-07 NOTE — Patient Instructions (Signed)

## 2020-12-07 NOTE — Progress Notes (Signed)
Urological Symptom Review  Patient is experiencing the following symptoms: Stream starts and stops Trouble starting stream Blood in urine Kidney stones  Review of Systems  Gastrointestinal (upper)  : Negative for upper GI symptoms  Gastrointestinal (lower) : Constipation  Constitutional : Negative for symptoms  Skin: Negative for skin symptoms  Eyes: Negative for eye symptoms  Ear/Nose/Throat : Negative for Ear/Nose/Throat symptoms  Hematologic/Lymphatic: Negative for Hematologic/Lymphatic symptoms  Cardiovascular : Negative for cardiovascular symptoms  Respiratory : Negative for respiratory symptoms  Endocrine: Negative for endocrine symptoms  Musculoskeletal: Back pain  Neurological: Negative for neurological symptoms  Psychologic: Negative for psychiatric symptoms

## 2020-12-09 ENCOUNTER — Ambulatory Visit: Payer: Self-pay | Admitting: Urology

## 2020-12-16 ENCOUNTER — Ambulatory Visit: Payer: Self-pay | Admitting: Urology

## 2020-12-16 ENCOUNTER — Telehealth: Payer: Self-pay

## 2020-12-16 NOTE — Telephone Encounter (Signed)
Patient missed his appointment today. He overslept.  Was not wanting to wait until next available. Was supposed to get a call back from Dr. Ronne Binning about results.  Please call pt back to advise.  Call back:  [phone]  Thanks, Rosey Bath

## 2020-12-17 NOTE — Telephone Encounter (Signed)
Patient had 1 week KUB- can you review (sent to you previously) thanks

## 2021-01-04 ENCOUNTER — Other Ambulatory Visit: Payer: Self-pay | Admitting: Urology

## 2021-01-04 DIAGNOSIS — N200 Calculus of kidney: Secondary | ICD-10-CM

## 2021-01-04 MED ORDER — TAMSULOSIN HCL 0.4 MG PO CAPS: 0.4000 mg | ORAL_CAPSULE | Freq: Every day | ORAL | 0 refills | Status: DC

## 2021-01-04 NOTE — Telephone Encounter (Signed)
Please see refill request.

## 2021-01-07 MED ORDER — OXYCODONE-ACETAMINOPHEN 5-325 MG PO TABS: 1.0000 | ORAL_TABLET | Freq: Four times a day (QID) | ORAL | 0 refills | Status: DC | PRN

## 2021-01-25 ENCOUNTER — Other Ambulatory Visit: Payer: Self-pay | Admitting: Urology

## 2021-01-25 DIAGNOSIS — N200 Calculus of kidney: Secondary | ICD-10-CM

## 2021-01-25 NOTE — Telephone Encounter (Signed)
See below

## 2021-01-26 ENCOUNTER — Emergency Department (HOSPITAL_COMMUNITY): Payer: Self-pay

## 2021-01-26 ENCOUNTER — Other Ambulatory Visit: Payer: Self-pay

## 2021-01-26 ENCOUNTER — Encounter (HOSPITAL_COMMUNITY): Payer: Self-pay | Admitting: *Deleted

## 2021-01-26 ENCOUNTER — Emergency Department (HOSPITAL_COMMUNITY)
Admission: EM | Admit: 2021-01-26 | Discharge: 2021-01-26 | Disposition: A | Discharge status: Home / Self Care | Payer: Self-pay | Attending: Emergency Medicine | Admitting: Emergency Medicine

## 2021-01-26 DIAGNOSIS — F17210 Nicotine dependence, cigarettes, uncomplicated: Secondary | ICD-10-CM | POA: Insufficient documentation

## 2021-01-26 DIAGNOSIS — N210 Calculus in bladder: Secondary | ICD-10-CM | POA: Insufficient documentation

## 2021-01-26 DIAGNOSIS — N390 Urinary tract infection, site not specified: Secondary | ICD-10-CM | POA: Insufficient documentation

## 2021-01-26 LAB — BASIC METABOLIC PANEL
Anion gap: 9 (ref 5–15)
BUN: 8 mg/dL (ref 6–20)
CO2: 25 mmol/L (ref 22–32)
Calcium: 9.3 mg/dL (ref 8.9–10.3)
Chloride: 105 mmol/L (ref 98–111)
Creatinine, Ser: 0.91 mg/dL (ref 0.61–1.24)
GFR, Estimated: >60 mL/min (ref >60)
Glucose, Bld: 97 mg/dL (ref 70–99)
Potassium: 3.9 mmol/L (ref 3.5–5.1)
Sodium: 139 mmol/L (ref 135–145)

## 2021-01-26 LAB — URINALYSIS, COMPLETE (UACMP) WITH MICROSCOPIC
Bilirubin Urine: NEGATIVE
Glucose, UA: NEGATIVE mg/dL
Ketones, ur: NEGATIVE mg/dL
Leukocytes,Ua: NEGATIVE
Nitrite: POSITIVE — AB
Protein, ur: NEGATIVE mg/dL
Specific Gravity, Urine: 1.019 (ref 1.005–1.030)
pH: 6 (ref 5.0–8.0)

## 2021-01-26 LAB — CBC WITH DIFFERENTIAL/PLATELET
Abs Immature Granulocytes: 0.05 10*3/uL (ref 0.00–0.07)
Basophils Absolute: 0.1 10*3/uL (ref 0.0–0.1)
Basophils Relative: 1 %
Eosinophils Absolute: 0.1 10*3/uL (ref 0.0–0.5)
Eosinophils Relative: 1 %
HCT: 52.8 % — ABNORMAL HIGH (ref 39.0–52.0)
Hemoglobin: 17.2 g/dL — ABNORMAL HIGH (ref 13.0–17.0)
Immature Granulocytes: 1 %
Lymphocytes Relative: 25 %
Lymphs Abs: 2.5 10*3/uL (ref 0.7–4.0)
MCH: 22.3 pg — ABNORMAL LOW (ref 26.0–34.0)
MCHC: 32.6 g/dL (ref 30.0–36.0)
MCV: 68.4 fL — ABNORMAL LOW (ref 80.0–100.0)
Monocytes Absolute: 0.8 10*3/uL (ref 0.1–1.0)
Monocytes Relative: 8 %
Neutro Abs: 6.5 10*3/uL (ref 1.7–7.7)
Neutrophils Relative %: 64 %
Platelets: 313 10*3/uL (ref 150–400)
RBC: 7.72 MIL/uL — ABNORMAL HIGH (ref 4.22–5.81)
RDW: 18.5 % — ABNORMAL HIGH (ref 11.5–15.5)
WBC: 10 10*3/uL (ref 4.0–10.5)
nRBC: 0 % (ref 0.0–0.2)

## 2021-01-26 MED ORDER — CEPHALEXIN 500 MG PO CAPS: 500.0000 mg | ORAL_CAPSULE | Freq: Four times a day (QID) | ORAL | 0 refills | Status: AC

## 2021-01-26 MED ORDER — OXYCODONE-ACETAMINOPHEN 5-325 MG PO TABS: 1.0000 | ORAL_TABLET | Freq: Four times a day (QID) | ORAL | 0 refills | Status: DC | PRN

## 2021-01-26 MED ORDER — TAMSULOSIN HCL 0.4 MG PO CAPS
0.4000 mg | ORAL_CAPSULE | Freq: Every day | ORAL | 0 refills | Status: DC
Start: 2021-01-26 — End: 2023-07-20

## 2021-01-26 MED ORDER — CEPHALEXIN 500 MG PO CAPS
500.0000 mg | ORAL_CAPSULE | Freq: Once | ORAL | Status: AC
  Administered 2021-01-26: 500 mg via ORAL
  Filled 2021-01-26: qty 1

## 2021-01-26 NOTE — ED Triage Notes (Signed)
Right lower quadrant pain onset last night

## 2021-01-26 NOTE — Discharge Instructions (Addendum)
Your CAT scan shows that your stone has passed into your bladder. Your urine looks like it may be infected, your testing for gonorrhea and chlamydia pending.  You can follow these results in your MyChart.  Today you declined treatment of this and if it is positive you will need to seek treatment.  Please schedule a follow-up appointment with urology.  You may have diarrhea from the antibiotics.  It is very important that you continue to take the antibiotics even if you get diarrhea unless a medical professional tells you that you may stop taking them.  If you stop too early the bacteria you are being treated for will become stronger and you may need different, more powerful antibiotics that have more side effects and worsening diarrhea.  Please stay well hydrated and consider probiotics as they may decrease the severity of your diarrhea.   Your ct scan showed lung nodules.  With smoking you are higher risk and should follow up for repeat CT scan to ensure that this is not a early form of lung cancer.  Additionally please stop smoking.  Your CT scan also showed hepatic steatosis or fatty liver.  Please avoid alcohol and follow up with primary care.
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
## 2021-01-27 LAB — GC/CHLAMYDIA PROBE AMP (~~LOC~~) NOT AT ARMC
Chlamydia: NEGATIVE
Comment: NEGATIVE
Comment: NORMAL
Neisseria Gonorrhea: NEGATIVE

## 2021-01-28 LAB — URINE CULTURE: Culture: NO GROWTH

## 2021-02-04 ENCOUNTER — Other Ambulatory Visit: Payer: Self-pay | Admitting: Urology

## 2021-02-04 DIAGNOSIS — N200 Calculus of kidney: Secondary | ICD-10-CM

## 2021-02-09 MED ORDER — OXYCODONE-ACETAMINOPHEN 5-325 MG PO TABS: 1.0000 | ORAL_TABLET | Freq: Four times a day (QID) | ORAL | 0 refills | Status: DC | PRN

## 2021-04-02 ENCOUNTER — Emergency Department (HOSPITAL_COMMUNITY)
Admission: EM | Admit: 2021-04-02 | Discharge: 2021-04-02 | Disposition: A | Discharge status: Home / Self Care | Payer: Self-pay | Attending: Emergency Medicine | Admitting: Emergency Medicine

## 2021-04-02 ENCOUNTER — Other Ambulatory Visit: Payer: Self-pay

## 2021-04-02 ENCOUNTER — Encounter (HOSPITAL_COMMUNITY): Payer: Self-pay

## 2021-04-02 DIAGNOSIS — F17210 Nicotine dependence, cigarettes, uncomplicated: Secondary | ICD-10-CM | POA: Insufficient documentation

## 2021-04-02 DIAGNOSIS — R300 Dysuria: Secondary | ICD-10-CM | POA: Insufficient documentation

## 2021-04-02 DIAGNOSIS — N4889 Other specified disorders of penis: Secondary | ICD-10-CM | POA: Insufficient documentation

## 2021-04-02 LAB — URINALYSIS, ROUTINE W REFLEX MICROSCOPIC
Bilirubin Urine: NEGATIVE
Glucose, UA: NEGATIVE mg/dL
Ketones, ur: 5 mg/dL — AB
Leukocytes,Ua: NEGATIVE
Nitrite: NEGATIVE
Protein, ur: NEGATIVE mg/dL
RBC / HPF: >50 RBC/hpf — ABNORMAL HIGH (ref 0–5)
Specific Gravity, Urine: 1.028 (ref 1.005–1.030)
pH: 6 (ref 5.0–8.0)

## 2021-04-02 MED ORDER — CIPROFLOXACIN HCL 500 MG PO TABS
500.0000 mg | ORAL_TABLET | Freq: Two times a day (BID) | ORAL | 0 refills | Status: DC
Start: 2021-04-02 — End: 2023-07-20

## 2021-04-02 NOTE — ED Triage Notes (Signed)
Pt reports pain to groin area, specifically to shaft of penis x 1 week. Pt says he noticed blood in urine one time, discomfort with urination.
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
## 2021-04-02 NOTE — Discharge Instructions (Addendum)
Begin taking Cipro as prescribed.  Follow-up with your primary doctor in the next week, and return to the ER if symptoms worsen or change.  We will call you if your culture indicates you require further treatment.

## 2021-04-06 LAB — GC/CHLAMYDIA PROBE AMP (~~LOC~~) NOT AT ARMC
Chlamydia: NEGATIVE
Comment: NEGATIVE
Comment: NORMAL
Neisseria Gonorrhea: NEGATIVE

## 2021-05-10 ENCOUNTER — Emergency Department (HOSPITAL_COMMUNITY): Payer: Self-pay

## 2021-05-10 ENCOUNTER — Other Ambulatory Visit: Payer: Self-pay

## 2021-05-10 ENCOUNTER — Encounter (HOSPITAL_COMMUNITY): Payer: Self-pay | Admitting: Emergency Medicine

## 2021-05-10 ENCOUNTER — Emergency Department (HOSPITAL_COMMUNITY)
Admission: EM | Admit: 2021-05-10 | Discharge: 2021-05-10 | Disposition: A | Discharge status: Home / Self Care | Payer: Self-pay | Attending: Emergency Medicine | Admitting: Emergency Medicine

## 2021-05-10 DIAGNOSIS — F17210 Nicotine dependence, cigarettes, uncomplicated: Secondary | ICD-10-CM | POA: Insufficient documentation

## 2021-05-10 DIAGNOSIS — R319 Hematuria, unspecified: Secondary | ICD-10-CM | POA: Insufficient documentation

## 2021-05-10 LAB — URINALYSIS, ROUTINE W REFLEX MICROSCOPIC
Bilirubin Urine: NEGATIVE
Glucose, UA: NEGATIVE mg/dL
Ketones, ur: NEGATIVE mg/dL
Leukocytes,Ua: NEGATIVE
Nitrite: NEGATIVE
Protein, ur: NEGATIVE mg/dL
Specific Gravity, Urine: 1.016 (ref 1.005–1.030)
pH: 6 (ref 5.0–8.0)

## 2021-05-10 MED ORDER — OXYCODONE-ACETAMINOPHEN 5-325 MG PO TABS
2.0000 | ORAL_TABLET | Freq: Once | ORAL | Status: AC
  Administered 2021-05-10: 2 via ORAL
  Filled 2021-05-10: qty 2

## 2021-05-10 MED ORDER — OXYCODONE-ACETAMINOPHEN 5-325 MG PO TABS: 1.0000 | ORAL_TABLET | ORAL | 0 refills | Status: DC | PRN

## 2021-05-10 NOTE — ED Triage Notes (Signed)
C/o burning with urination since June and July d/t possible kidney stone.  Rates pain 10/10.
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
## 2021-05-24 ENCOUNTER — Ambulatory Visit (INDEPENDENT_AMBULATORY_CARE_PROVIDER_SITE_OTHER): Payer: Self-pay | Admitting: Urology

## 2021-05-24 ENCOUNTER — Encounter: Payer: Self-pay | Admitting: Urology

## 2021-05-24 ENCOUNTER — Other Ambulatory Visit: Payer: Self-pay

## 2021-05-24 VITALS — BP 130/86 | HR 81 | Temp 98.6°F

## 2021-05-24 DIAGNOSIS — N210 Calculus in bladder: Secondary | ICD-10-CM

## 2021-05-24 DIAGNOSIS — N200 Calculus of kidney: Secondary | ICD-10-CM

## 2021-05-24 LAB — MICROSCOPIC EXAMINATION
Epithelial Cells (non renal): NONE SEEN /hpf (ref 0–10)
Renal Epithel, UA: NONE SEEN /hpf
WBC, UA: NONE SEEN /hpf (ref 0–5)

## 2021-05-24 LAB — URINALYSIS, ROUTINE W REFLEX MICROSCOPIC
Bilirubin, UA: NEGATIVE
Glucose, UA: NEGATIVE
Ketones, UA: NEGATIVE
Leukocytes,UA: NEGATIVE
Nitrite, UA: NEGATIVE
Protein,UA: NEGATIVE
Specific Gravity, UA: 1.025 (ref 1.005–1.030)
Urobilinogen, Ur: 1 mg/dL (ref 0.2–1.0)
pH, UA: 6 (ref 5.0–7.5)

## 2021-05-24 MED ORDER — OXYCODONE-ACETAMINOPHEN 5-325 MG PO TABS: 1.0000 | ORAL_TABLET | ORAL | 0 refills | Status: DC | PRN
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
## 2021-05-24 NOTE — Progress Notes (Signed)
Urological Symptom Review  Patient is experiencing the following symptoms: Frequent urination Burning/pain with urination Urinary tract infection Painful intercourse Kidney Stones    Review of Systems  Gastrointestinal (upper)  : Negative for upper GI symptoms  Gastrointestinal (lower) : Negative for lower GI symptoms  Constitutional : Negative for symptoms  Skin: Negative for skin symptoms  Eyes: Negative for eye symptoms  Ear/Nose/Throat : Negative for Ear/Nose/Throat symptoms  Hematologic/Lymphatic: Negative for Hematologic/Lymphatic symptoms  Cardiovascular : Negative for cardiovascular symptoms  Respiratory : Negative for respiratory symptoms  Endocrine: Negative for endocrine symptoms  Musculoskeletal: Negative for musculoskeletal symptoms  Neurological: Negative for neurological symptoms  Psychologic: Negative for psychiatric symptoms
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
## 2021-05-24 NOTE — Patient Instructions (Signed)
Bladder Stone A bladder stone is a buildup of crystals made from the proteins and minerals found in urine. These substances build up when urine becomes too concentrated. Urine is concentrated when there is less water and more proteins and minerals in it. Bladder stones usually develop when a person has another medical condition that prevents the bladder from emptying completely. Crystals can form in the small amount of urine that is left in the bladder. Bladder stones that grow large can become painful and may block the flow of urine. What are the causes? This condition may be caused by: An enlarged prostate, which prevents the bladder from emptying well. An infection of a part of your urinary system (urinary tract infection, or UTI). This includes the: Kidneys. Bladder. Ureters. These are the tubes that carry urine to your bladder. Urethra. This is the tube that drains urine from your bladder. A weak spot in the bladder that creates a small pouch (bladder diverticulum). Nerve damage that may interfere with the signals from your brain to your bladder muscles (neurogenic bladder). This can result from conditions such as Parkinson's disease or spinal cord injuries. What increases the risk? This condition is more likely to develop in people who: Get frequent UTIs. Have another medical condition that affects the bladder. Have a history of bladder surgery. Have a spinal cord injury. Have an abnormal shape of the bladder (deformity). What are the signs or symptoms? Common symptoms of this condition include: Pain in the abdomen. A need to urinate more often. Difficulty or pain when urinating. Blood in the urine. Cloudy urine or urine that is dark in color. Pain in the penis or testicles in men. Small bladder stones do not always cause symptoms. How is this diagnosed? This condition may be diagnosed based on your symptoms, medical history, and physical exam. The physical exam will check for  tenderness in your abdomen. For men, an exam in the rectum may be done to check the prostate gland. You may have tests, such as: A urine test (urinalysis). A urine sample test to check for other infections (culture). Blood tests, including tests to look for a certain substance (creatinine). A creatinine level that is higher than normal could indicate a blockage. A procedure to check your bladder using a scope with a camera (cystoscopy). You may also have imaging studies, such as: CT scan or ultrasound of your abdomen and the area between your hip bones (pelvis or pelvic area). An X-ray of your urinary system. How is this treated? This condition may be treated with: Cystolitholapaxy. This procedure uses a laser, ultrasound, or other device to break the stone into smaller pieces. Fluids are used to flush the small pieces from the area. Surgery to remove the stone. A stent. This is a small mesh tube that is threaded into your ureter to make urine flow. Medicines to treat pain. Follow these instructions at home: Medicines Take over-the-counter and prescription medicines only as told by your health care provider. Ask your health care provider if the medicine prescribed to you: Requires you to avoid driving or using heavy machinery. Can cause constipation. You may need to take these actions to prevent or treat constipation: Take over-the-counter or prescription medicines. Eat foods that are high in fiber, such as beans, whole grains, and fresh fruits and vegetables. Limit foods that are high in fat and processed sugars, such as fried or sweet foods. Alcohol use Do not drink alcohol if: Your health care provider tells you not to drink.   You are pregnant, may be pregnant, or are planning to become pregnant. If you drink alcohol: Limit how much you drink to: 0-1 drink a day for women. 0-2 drinks a day for men. Be aware of how much alcohol is in your drink. In the [place] one drink equals one 12  oz bottle of beer (355 mL), one 5 oz glass of wine (148 mL), or one 1 oz glass of hard liquor (44 mL). Activity Rest as told by your health care provider. Return to your normal activities as told by your health care provider. Ask your health care provider what activities are safe for you. General instructions  Drink enough fluid to keep your urine pale yellow. Tell your health care provider about any unusual symptoms related to urinating. Early diagnosis of an enlarged prostate and other bladder conditions may reduce your risk of getting bladder stones. Do not use any products that contain nicotine or tobacco, such as cigarettes, e-cigarettes, or chewing tobacco. If you need help quitting, ask your health care provider. Do not use drugs. Where to find more information [facility] ([facility]): www.urologyhealth.org Contact a health care provider if you: Have a fever. Feel nauseous or vomit. Are unable to urinate. Have a large amount of blood in your urine. Get help right away if you: Have severe back pain or pain in the lower part of your abdomen. Cannot eat or drink without vomiting. Vomit after taking your medicine. Summary A bladder stone is a buildup of crystals made from the proteins and minerals found in urine. These substances build up when urine becomes too concentrated. Bladder stones that grow large can become painful and may block the flow of urine. Bladder stones may be treated with a laser, a stent, surgery, or pain medicines. This information is not intended to replace advice given to you by your health care provider. Make sure you discuss any questions you have with your health care provider. Document Revised: 02/07/2019 Document Reviewed: 02/07/2019 Elsevier Patient Education  2022 [employer]  

## 2021-05-27 ENCOUNTER — Other Ambulatory Visit: Payer: Self-pay | Admitting: Urology

## 2021-05-28 ENCOUNTER — Other Ambulatory Visit: Payer: Self-pay | Admitting: Urology

## 2021-05-28 NOTE — Telephone Encounter (Signed)
Please advise 

## 2021-05-29 ENCOUNTER — Other Ambulatory Visit: Payer: Self-pay | Admitting: Urology

## 2021-05-31 NOTE — Patient Instructions (Signed)
Joshua Blevins  05/31/2021     @PREFPERIOPPHARMACY @   Your procedure is scheduled on  06/03/2021.   Report to 13/09/2020 at  [id number]  A.M.   Call this number if you have problems the morning of surgery:  [phone]   Remember:  Do not eat or drink after midnight.      Take these medicines the morning of surgery with A SIP OF WATER                     oxycodone (if needed).    Do not wear jewelry, make-up or nail polish.  Do not wear lotions, powders, or perfumes, or deodorant.  Do not shave 48 hours prior to surgery.  Men may shave face and neck.  Do not bring valuables to the hospital.  [facility] is not responsible for any belongings or valuables.  Contacts, dentures or bridgework may not be worn into surgery.  Leave your suitcase in the car.  After surgery it may be brought to your room.  For patients admitted to the hospital, discharge time will be determined by your treatment team.  Patients discharged the day of surgery will not be allowed to drive home and must have someone with them for 24 hours.    Special instructions:   DO NOT smoke tobacco or vape for 24 hours before your procedure.  Please read over the following fact sheets that you were given. Coughing and Deep Breathing, Surgical Site Infection Prevention, Anesthesia Post-op Instructions, and Care and Recovery After Surgery      Laser Therapy for Kidney Stones, Care After This sheet gives you information about how to care for yourself after your procedure. Your health care provider may also give you more specific instructions. If you have problems or questions, contact your health care provider. What can I expect after the procedure? After the procedure, it is common to have: Pain. A burning sensation while urinating. Small amounts of blood in your urine. A need to urinate frequently. Pieces of kidney stone in your urine. Mild discomfort when urinating that may be felt in the back. You  may experience this if you have a flexible tube (stent) in your ureter. Follow these instructions at home: Medicines Take over-the-counter and prescription medicines only as told by your health care provider. If you were prescribed an antibiotic medicine, take it as told by your health care provider. Do not stop taking the antibiotic even if you start to feel better. Ask your health care provider if the medicine prescribed to you: Requires you to avoid driving or using heavy machinery. Can cause constipation. You may need to take actions to prevent or treat constipation, such as: Take over-the-counter or prescription medicines. Eat foods that are high in fiber, such as beans, whole grains, and fresh fruits and vegetables. Limit foods that are high in fat and processed sugars, such as fried or sweet foods. Activity Return to your normal activities as told by your health care provider. Ask your health care provider what activities are safe for you. Do not drive for 24 hours if you were given a sedative during your procedure. General instructions If your health care provider approves, you may take a warm bath to ease discomfort and burning. Drink enough fluid to keep your urine pale yellow. Your health care provider may recommend drinking two 8 oz (237 mL) glasses of water per hour for a few hours after  your procedure. You may be asked to strain your urine to collect any stone fragments that you pass. These fragments may be tested. Keep all follow-up visits as told by your health care provider. This is important. If you have a stent, you will need to return to your health care provider to have the stent removed. Contact a health care provider if you: Have pain or a burning feeling that lasts more than 2 days. Feel nauseous. Vomit more and more often. Have difficulty urinating. Have pain that gets worse or does not get better with medicine. Get help right away if: You are unable to urinate,  even if your bladder feels full. You have: Bright red blood or blood clots in your urine. More blood in your urine. Severe pain or discomfort. A fever or shaking chills. Abdominal pain. Difficulty breathing. Swelling in your legs. Summary After the procedure, it is common to have a burning sensation while urinating and small amounts of blood in your urine. Take over-the-counter and prescription medicines only as told by your health care provider. Drink enough fluid to keep your urine pale yellow. Keep all follow-up visits as told by your health care provider. This is important. This information is not intended to replace advice given to you by your health care provider. Make sure you discuss any questions you have with your health care provider. Document Revised: 03/29/2018 Document Reviewed: 03/29/2018 Elsevier Patient Education  2022 [employer]. General Anesthesia, Adult, Care After This sheet gives you information about how to care for yourself after your procedure. Your health care provider may also give you more specific instructions. If you have problems or questions, contact your health care provider. What can I expect after the procedure? After the procedure, the following side effects are common: Pain or discomfort at the IV site. Nausea. Vomiting. Sore throat. Trouble concentrating. Feeling cold or chills. Feeling weak or tired. Sleepiness and fatigue. Soreness and body aches. These side effects can affect parts of the body that were not involved in surgery. Follow these instructions at home: For the time period you were told by your health care provider:  Rest. Do not participate in activities where you could fall or become injured. Do not drive or use machinery. Do not drink alcohol. Do not take sleeping pills or medicines that cause drowsiness. Do not make important decisions or sign legal documents. Do not take care of children on your own. Eating and  drinking Follow any instructions from your health care provider about eating or drinking restrictions. When you feel hungry, start by eating small amounts of foods that are soft and easy to digest (bland), such as toast. Gradually return to your regular diet. Drink enough fluid to keep your urine pale yellow. If you vomit, rehydrate by drinking water, juice, or clear broth. General instructions If you have sleep apnea, surgery and certain medicines can increase your risk for breathing problems. Follow instructions from your health care provider about wearing your sleep device: Anytime you are sleeping, including during daytime naps. While taking prescription pain medicines, sleeping medicines, or medicines that make you drowsy. Have a responsible adult stay with you for the time you are told. It is important to have someone help care for you until you are awake and alert. Return to your normal activities as told by your health care provider. Ask your health care provider what activities are safe for you. Take over-the-counter and prescription medicines only as told by your health care provider. If you  smoke, do not smoke without supervision. Keep all follow-up visits as told by your health care provider. This is important. Contact a health care provider if: You have nausea or vomiting that does not get better with medicine. You cannot eat or drink without vomiting. You have pain that does not get better with medicine. You are unable to pass urine. You develop a skin rash. You have a fever. You have redness around your IV site that gets worse. Get help right away if: You have difficulty breathing. You have chest pain. You have blood in your urine or stool, or you vomit blood. Summary After the procedure, it is common to have a sore throat or nausea. It is also common to feel tired. Have a responsible adult stay with you for the time you are told. It is important to have someone help care  for you until you are awake and alert. When you feel hungry, start by eating small amounts of foods that are soft and easy to digest (bland), such as toast. Gradually return to your regular diet. Drink enough fluid to keep your urine pale yellow. Return to your normal activities as told by your health care provider. Ask your health care provider what activities are safe for you. This information is not intended to replace advice given to you by your health care provider. Make sure you discuss any questions you have with your health care provider. Document Revised: 04/02/2020 Document Reviewed: 10/31/2019 Elsevier Patient Education  2022 [employer] Inc. How to Use Chlorhexidine for Bathing Chlorhexidine gluconate (CHG) is a germ-killing (antiseptic) solution that is used to clean the skin. It can get rid of the bacteria that normally live on the skin and can keep them away for about 24 hours. To clean your skin with CHG, you may be given: A CHG solution to use in the shower or as part of a sponge bath. A prepackaged cloth that contains CHG. Cleaning your skin with CHG may help lower the risk for infection: While you are staying in the intensive care unit of the hospital. If you have a vascular access, such as a central line, to provide short-term or long-term access to your veins. If you have a catheter to drain urine from your bladder. If you are on a ventilator. A ventilator is a machine that helps you breathe by moving air in and out of your lungs. After surgery. What are the risks? Risks of using CHG include: A skin reaction. Hearing loss, if CHG gets in your ears and you have a perforated eardrum. Eye injury, if CHG gets in your eyes and is not rinsed out. The CHG product catching fire. Make sure that you avoid smoking and flames after applying CHG to your skin. Do not use CHG: If you have a chlorhexidine allergy or have previously reacted to chlorhexidine. On babies younger than 19  months of age. How to use CHG solution Use CHG only as told by your health care provider, and follow the instructions on the label. Use the full amount of CHG as directed. Usually, this is one bottle. During a shower Follow these steps when using CHG solution during a shower (unless your health care provider gives you different instructions): Start the shower. Use your normal soap and shampoo to wash your face and hair. Turn off the shower or move out of the shower stream. Pour the CHG onto a clean washcloth. Do not use any type of brush or rough-edged sponge. Starting at your neck,  lather your body down to your toes. Make sure you follow these instructions: If you will be having surgery, pay special attention to the part of your body where you will be having surgery. Scrub this area for at least 1 minute. Do not use CHG on your head or face. If the solution gets into your ears or eyes, rinse them well with water. Avoid your genital area. Avoid any areas of skin that have broken skin, cuts, or scrapes. Scrub your back and under your arms. Make sure to wash skin folds. Let the lather sit on your skin for 1-2 minutes or as long as told by your health care provider. Thoroughly rinse your entire body in the shower. Make sure that all body creases and crevices are rinsed well. Dry off with a clean towel. Do not put any substances on your body afterward--such as powder, lotion, or perfume--unless you are told to do so by your health care provider. Only use lotions that are recommended by the manufacturer. Put on clean clothes or pajamas. If it is the night before your surgery, sleep in clean sheets.  During a sponge bath Follow these steps when using CHG solution during a sponge bath (unless your health care provider gives you different instructions): Use your normal soap and shampoo to wash your face and hair. Pour the CHG onto a clean washcloth. Starting at your neck, lather your body down to  your toes. Make sure you follow these instructions: If you will be having surgery, pay special attention to the part of your body where you will be having surgery. Scrub this area for at least 1 minute. Do not use CHG on your head or face. If the solution gets into your ears or eyes, rinse them well with water. Avoid your genital area. Avoid any areas of skin that have broken skin, cuts, or scrapes. Scrub your back and under your arms. Make sure to wash skin folds. Let the lather sit on your skin for 1-2 minutes or as long as told by your health care provider. Using a different clean, wet washcloth, thoroughly rinse your entire body. Make sure that all body creases and crevices are rinsed well. Dry off with a clean towel. Do not put any substances on your body afterward--such as powder, lotion, or perfume--unless you are told to do so by your health care provider. Only use lotions that are recommended by the manufacturer. Put on clean clothes or pajamas. If it is the night before your surgery, sleep in clean sheets. How to use CHG prepackaged cloths Only use CHG cloths as told by your health care provider, and follow the instructions on the label. Use the CHG cloth on clean, dry skin. Do not use the CHG cloth on your head or face unless your health care provider tells you to. When washing with the CHG cloth: Avoid your genital area. Avoid any areas of skin that have broken skin, cuts, or scrapes. Before surgery Follow these steps when using a CHG cloth to clean before surgery (unless your health care provider gives you different instructions): Using the CHG cloth, vigorously scrub the part of your body where you will be having surgery. Scrub using a back-and-forth motion for 3 minutes. The area on your body should be completely wet with CHG when you are done scrubbing. Do not rinse. Discard the cloth and let the area air-dry. Do not put any substances on the area afterward, such as powder,  lotion, or perfume. Put on  clean clothes or pajamas. If it is the night before your surgery, sleep in clean sheets.  For general bathing Follow these steps when using CHG cloths for general bathing (unless your health care provider gives you different instructions). Use a separate CHG cloth for each area of your body. Make sure you wash between any folds of skin and between your fingers and toes. Wash your body in the following order, switching to a new cloth after each step: The front of your neck, shoulders, and chest. Both of your arms, under your arms, and your hands. Your stomach and groin area, avoiding the genitals. Your right leg and foot. Your left leg and foot. The back of your neck, your back, and your buttocks. Do not rinse. Discard the cloth and let the area air-dry. Do not put any substances on your body afterward--such as powder, lotion, or perfume--unless you are told to do so by your health care provider. Only use lotions that are recommended by the manufacturer. Put on clean clothes or pajamas. Contact a health care provider if: Your skin gets irritated after scrubbing. You have questions about using your solution or cloth. You swallow any chlorhexidine. Call your local poison control center ([phone] in the [place] Get help right away if: Your eyes itch badly, or they become very red or swollen. Your skin itches badly and is red or swollen. Your hearing changes. You have trouble seeing. You have swelling or tingling in your mouth or throat. You have trouble breathing. These symptoms may represent a serious problem that is an emergency. Do not wait to see if the symptoms will go away. Get medical help right away. Call your local emergency services (911 in the [place] Do not drive yourself to the hospital. Summary Chlorhexidine gluconate (CHG) is a germ-killing (antiseptic) solution that is used to clean the skin. Cleaning your skin with CHG may help to lower your  risk for infection. You may be given CHG to use for bathing. It may be in a bottle or in a prepackaged cloth to use on your skin. Carefully follow your health care provider's instructions and the instructions on the product label. Do not use CHG if you have a chlorhexidine allergy. Contact your health care provider if your skin gets irritated after scrubbing. This information is not intended to replace advice given to you by your health care provider. Make sure you discuss any questions you have with your health care provider. Document Revised: 09/28/2020 Document Reviewed: 09/28/2020 Elsevier Patient Education  2022 [employer].

## 2021-05-31 NOTE — Telephone Encounter (Signed)
3rd request for pain medication

## 2021-06-01 ENCOUNTER — Other Ambulatory Visit: Payer: Self-pay

## 2021-06-01 ENCOUNTER — Encounter (HOSPITAL_COMMUNITY): Payer: Self-pay

## 2021-06-01 ENCOUNTER — Encounter (HOSPITAL_COMMUNITY)
Admission: RE | Admit: 2021-06-01 | Discharge: 2021-06-01 | Disposition: A | Discharge status: Home / Self Care | Payer: Self-pay | Source: Ambulatory Visit | Attending: Urology | Admitting: Urology

## 2021-06-01 HISTORY — DX: Personal history of urinary calculi: Z87.442

## 2021-06-02 ENCOUNTER — Other Ambulatory Visit: Payer: Self-pay | Admitting: Urology

## 2021-06-02 NOTE — Telephone Encounter (Signed)
Please advise- patient 4th request.

## 2021-06-03 ENCOUNTER — Ambulatory Visit (HOSPITAL_COMMUNITY): Payer: Self-pay | Admitting: Certified Registered Nurse Anesthetist

## 2021-06-03 ENCOUNTER — Encounter (HOSPITAL_COMMUNITY)
Admission: RE | Disposition: A | Discharge status: Home / Self Care | Payer: Self-pay | Source: Home / Self Care | Attending: Urology

## 2021-06-03 ENCOUNTER — Encounter (HOSPITAL_COMMUNITY): Payer: Self-pay | Admitting: Urology

## 2021-06-03 ENCOUNTER — Ambulatory Visit (HOSPITAL_COMMUNITY)
Admission: RE | Admit: 2021-06-03 | Discharge: 2021-06-03 | Disposition: A | Discharge status: Home / Self Care | Payer: Self-pay | Attending: Urology | Admitting: Urology

## 2021-06-03 ENCOUNTER — Ambulatory Visit (HOSPITAL_COMMUNITY): Payer: Self-pay

## 2021-06-03 DIAGNOSIS — N1330 Unspecified hydronephrosis: Secondary | ICD-10-CM

## 2021-06-03 DIAGNOSIS — F17210 Nicotine dependence, cigarettes, uncomplicated: Secondary | ICD-10-CM | POA: Insufficient documentation

## 2021-06-03 DIAGNOSIS — Z87442 Personal history of urinary calculi: Secondary | ICD-10-CM | POA: Insufficient documentation

## 2021-06-03 DIAGNOSIS — Z791 Long term (current) use of non-steroidal anti-inflammatories (NSAID): Secondary | ICD-10-CM | POA: Insufficient documentation

## 2021-06-03 DIAGNOSIS — N210 Calculus in bladder: Secondary | ICD-10-CM

## 2021-06-03 DIAGNOSIS — N201 Calculus of ureter: Secondary | ICD-10-CM

## 2021-06-03 DIAGNOSIS — Z79899 Other long term (current) drug therapy: Secondary | ICD-10-CM | POA: Insufficient documentation

## 2021-06-03 DIAGNOSIS — N132 Hydronephrosis with renal and ureteral calculous obstruction: Secondary | ICD-10-CM | POA: Insufficient documentation

## 2021-06-03 HISTORY — PX: CYSTOSCOPY WITH STENT PLACEMENT: SHX5790

## 2021-06-03 HISTORY — PX: HOLMIUM LASER APPLICATION: SHX5852

## 2021-06-03 HISTORY — PX: CYSTOSCOPY W/ RETROGRADES: SHX1426

## 2021-06-03 SURGERY — CYSTOSCOPY, WITH RETROGRADE PYELOGRAM
Anesthesia: General | Laterality: Right

## 2021-06-03 MED ORDER — ONDANSETRON HCL 4 MG/2ML IJ SOLN: 4.0000 mg | Freq: Once | INTRAMUSCULAR | Status: DC | PRN

## 2021-06-03 MED ORDER — ORAL CARE MOUTH RINSE: 15.0000 mL | Freq: Once | OROMUCOSAL | Status: DC

## 2021-06-03 MED ORDER — OXYCODONE-ACETAMINOPHEN 5-325 MG PO TABS: 1.0000 | ORAL_TABLET | ORAL | 0 refills | Status: DC | PRN

## 2021-06-03 MED ORDER — SEVOFLURANE IN SOLN
RESPIRATORY_TRACT | Status: AC
  Filled 2021-06-03: qty 250

## 2021-06-03 MED ORDER — IPRATROPIUM-ALBUTEROL 0.5-2.5 (3) MG/3ML IN SOLN
3.0000 mL | RESPIRATORY_TRACT | Status: DC
  Administered 2021-06-03: 3 mL via RESPIRATORY_TRACT

## 2021-06-03 MED ORDER — OXYCODONE-ACETAMINOPHEN 5-325 MG PO TABS
ORAL_TABLET | ORAL | Status: AC
  Filled 2021-06-03: qty 1

## 2021-06-03 MED ORDER — LACTATED RINGERS IV SOLN: INTRAVENOUS | Status: DC | PRN

## 2021-06-03 MED ORDER — CEFAZOLIN SODIUM-DEXTROSE 2-4 GM/100ML-% IV SOLN
2.0000 g | INTRAVENOUS | Status: AC
  Administered 2021-06-03: 2 g via INTRAVENOUS
  Filled 2021-06-03: qty 100

## 2021-06-03 MED ORDER — LIDOCAINE HCL (CARDIAC) PF 100 MG/5ML IV SOSY
PREFILLED_SYRINGE | INTRAVENOUS | Status: DC | PRN
  Administered 2021-06-03: 60 mg via INTRAVENOUS

## 2021-06-03 MED ORDER — OXYCODONE-ACETAMINOPHEN 5-325 MG PO TABS
1.0000 | ORAL_TABLET | Freq: Once | ORAL | Status: AC
  Administered 2021-06-03: 1 via ORAL

## 2021-06-03 MED ORDER — PROPOFOL 10 MG/ML IV BOLUS
INTRAVENOUS | Status: AC
  Filled 2021-06-03: qty 40

## 2021-06-03 MED ORDER — ONDANSETRON HCL 4 MG/2ML IJ SOLN
INTRAMUSCULAR | Status: DC | PRN
  Administered 2021-06-03: 4 mg via INTRAVENOUS

## 2021-06-03 MED ORDER — WATER FOR IRRIGATION, STERILE IR SOLN
Status: DC | PRN
  Administered 2021-06-03: 1000 mL

## 2021-06-03 MED ORDER — FENTANYL CITRATE PF 50 MCG/ML IJ SOSY
25.0000 ug | PREFILLED_SYRINGE | INTRAMUSCULAR | Status: DC | PRN
  Administered 2021-06-03: 50 ug via INTRAVENOUS
  Filled 2021-06-03: qty 1

## 2021-06-03 MED ORDER — FENTANYL CITRATE (PF) 250 MCG/5ML IJ SOLN
INTRAMUSCULAR | Status: AC
  Filled 2021-06-03: qty 5

## 2021-06-03 MED ORDER — CHLORHEXIDINE GLUCONATE 0.12 % MT SOLN: 15.0000 mL | Freq: Once | OROMUCOSAL | Status: DC

## 2021-06-03 MED ORDER — DEXMEDETOMIDINE (PRECEDEX) IN NS 20 MCG/5ML (4 MCG/ML) IV SYRINGE
PREFILLED_SYRINGE | INTRAVENOUS | Status: AC
  Filled 2021-06-03: qty 5

## 2021-06-03 MED ORDER — IPRATROPIUM-ALBUTEROL 0.5-2.5 (3) MG/3ML IN SOLN
RESPIRATORY_TRACT | Status: AC
  Filled 2021-06-03: qty 3

## 2021-06-03 MED ORDER — SULFAMETHOXAZOLE-TRIMETHOPRIM 800-160 MG PO TABS: 1.0000 | ORAL_TABLET | Freq: Two times a day (BID) | ORAL | 0 refills | Status: DC

## 2021-06-03 MED ORDER — LACTATED RINGERS IV SOLN: INTRAVENOUS | Status: DC

## 2021-06-03 MED ORDER — FENTANYL CITRATE (PF) 250 MCG/5ML IJ SOLN
INTRAMUSCULAR | Status: DC | PRN
  Administered 2021-06-03 (×3): 50 ug via INTRAVENOUS

## 2021-06-03 MED ORDER — SODIUM CHLORIDE 0.9 % IR SOLN
Status: DC | PRN
  Administered 2021-06-03: 3000 mL

## 2021-06-03 MED ORDER — DIATRIZOATE MEGLUMINE 30 % UR SOLN
URETHRAL | Status: AC
  Filled 2021-06-03: qty 100

## 2021-06-03 MED ORDER — MIDAZOLAM HCL 2 MG/2ML IJ SOLN
INTRAMUSCULAR | Status: AC
  Filled 2021-06-03: qty 2

## 2021-06-03 MED ORDER — DIATRIZOATE MEGLUMINE 30 % UR SOLN
URETHRAL | Status: DC | PRN
  Administered 2021-06-03: 6 mL

## 2021-06-03 MED ORDER — PROPOFOL 10 MG/ML IV BOLUS
INTRAVENOUS | Status: DC | PRN
  Administered 2021-06-03: 200 mg via INTRAVENOUS

## 2021-06-03 MED ORDER — DEXMEDETOMIDINE (PRECEDEX) IN NS 20 MCG/5ML (4 MCG/ML) IV SYRINGE
PREFILLED_SYRINGE | INTRAVENOUS | Status: DC | PRN
  Administered 2021-06-03 (×2): 8 ug via INTRAVENOUS

## 2021-06-03 MED ORDER — MIDAZOLAM HCL 2 MG/2ML IJ SOLN
INTRAMUSCULAR | Status: DC | PRN
  Administered 2021-06-03: 2 mg via INTRAVENOUS

## 2021-06-03 SURGICAL SUPPLY — 21 items
BAG DRAIN URO TABLE W/ADPT NS (BAG) ×3 IMPLANT
BAG DRN 8 ADPR NS SKTRN CSTL (BAG) ×2
BAG HAMPER (MISCELLANEOUS) ×3 IMPLANT
CATH INTERMIT  6FR 70CM (CATHETERS) ×3 IMPLANT
CLOTH BEACON ORANGE TIMEOUT ST (SAFETY) ×3 IMPLANT
EXTRACTOR STONE NITINOL NGAGE (UROLOGICAL SUPPLIES) ×1 IMPLANT
GLOVE SURG POLYISO LF SZ8 (GLOVE) ×3 IMPLANT
GLOVE SURG UNDER POLY LF SZ7 (GLOVE) ×6 IMPLANT
GOWN STRL REUS W/TWL LRG LVL3 (GOWN DISPOSABLE) ×3 IMPLANT
GOWN STRL REUS W/TWL XL LVL3 (GOWN DISPOSABLE) ×3 IMPLANT
GUIDEWIRE STR ZIPWIRE 035X150 (MISCELLANEOUS) ×1 IMPLANT
IV NS IRRIG 3000ML ARTHROMATIC (IV SOLUTION) ×3 IMPLANT
KIT TURNOVER CYSTO (KITS) ×3 IMPLANT
MANIFOLD NEPTUNE II (INSTRUMENTS) ×3 IMPLANT
PACK CYSTO (CUSTOM PROCEDURE TRAY) ×3 IMPLANT
PAD ARMBOARD 7.5X6 YLW CONV (MISCELLANEOUS) ×3 IMPLANT
STENT URET 6FRX26 CONTOUR (STENTS) ×1 IMPLANT
TOWEL OR 17X26 4PK STRL BLUE (TOWEL DISPOSABLE) ×3 IMPLANT
TRACTIP FLEXIVA PULS ID 200XHI (Laser) IMPLANT
TRACTIP FLEXIVA PULSE ID 200 (Laser) ×3
WATER STERILE IRR 500ML POUR (IV SOLUTION) ×3 IMPLANT

## 2021-06-03 NOTE — Op Note (Signed)
Preoperative diagnosis: bladder calculus  Postoperative diagnosis: right ureteral calculus  Procedure: 1 cystoscopy 2 right retrograde pyelography 3.  Intraoperative fluoroscopy, under one hour, with interpretation 4.  Right ureteroscopic stone manipulation with laser lithotripsy 5.  Right 6 x 26 JJ stent placement  Attending: Wilkie Aye  Anesthesia: General  Estimated blood loss: None  Drains: Right 6 x 26 JJ ureteral stent with tether  Specimens: stone for analysis  Antibiotics: ancef  Findings: Right distal ureteral calculus with mild proximal hydronephrosis.  Indications: Patient is a 40 year old male with a history of ureteral stone  who was found to have a bladder calculus on CT imaging.  After discussing treatment options, he decided proceed with cystolithalopaxy versus right ureteroscopic stone manipulation.  Procedure in detail: The patient was brought to the operating room and a brief timeout was done to ensure correct patient, correct procedure, correct site.  General anesthesia was administered patient was placed in dorsal lithotomy position.  Her genitalia was then prepped and draped in usual sterile fashion.  A rigid 22 French cystoscope was passed in the urethra and the bladder.  Bladder was inspected free masses or lesions.  the ureteral orifices were in the normal orthotopic locations. No stone was located int he bladder. a 6 french ureteral catheter was then instilled into the right ureter orifice.  a gentle retrograde was obtained and findings noted above.  we then placed a zip wire through the ureteral catheter and advanced up to the renal pelvis.  we then removed the cystoscope and cannulated the right ureteral orifice with a semirigid ureteroscope.  we then encountered the stone in the distal ureter.  using using a 242nm laser fiber and fragmented the stone into smaller pieces.  the pieces were then removed with a Ngage basket.  We then placed and good coil was  noted in the the renal pelvis under fluoroscopy and the bladder under direct vision.   once all stone fragments were removed we then placed a 6 x 26 double-j ureteral stent over the original zip wire.  the stone fragments were then removed from the bladder and sent for analysis.   the bladder was then drained and this concluded the procedure which was well tolerated by patient.  Complications: None  Condition: Stable, extubated, transferred to PACU  Plan: Patient is to be discharged home as to follow-up in one week. He is to remove his stent in 72 hours by pulling the tether

## 2021-06-03 NOTE — Transfer of Care (Signed)
Immediate Anesthesia Transfer of Care Note  Patient: Joshua Blevins  Procedure(s) Performed: CYSTOSCOPY WITH RIGHT RETROGRADE PYELOGRAM (Right) HOLMIUM LASER APPLICATION RIGHT URETERAL CALCULUS WITH STONE BASKET EXTRACTION  Patient Location: PACU  Anesthesia Type:General  Level of Consciousness: drowsy  Airway & Oxygen Therapy: Patient Spontanous Breathing and Patient connected to nasal cannula oxygen  Post-op Assessment: Report given to RN and Post -op Vital signs reviewed and stable  Post vital signs: Reviewed and stable  Last Vitals:  Vitals Value Taken Time  BP 95/57   Temp    Pulse 80 06/03/21 0838  Resp 26 06/03/21 0838  SpO2 95 % 06/03/21 0838  Vitals shown include unvalidated device data.  Last Pain:  Vitals:   06/03/21 0717  TempSrc: Oral  PainSc: 0-No pain      Patients Stated Pain Goal: 6 (06/03/21 0717)  Complications: No notable events documented.

## 2021-06-03 NOTE — Interval H&P Note (Signed)
History and Physical Interval Note:  06/03/2021 7:39 AM  Joshua Blevins  has presented today for surgery, with the diagnosis of bladder calculus.  The various methods of treatment have been discussed with the patient and family. After consideration of risks, benefits and other options for treatment, the patient has consented to  Procedure(s): CYSTOSCOPY WITH RIGHT RETROGRADE PYELOGRAM (Right) CYSTOSCOPY WITH LITHOLAPAXY (N/A) HOLMIUM LASER APPLICATION (N/A) as a surgical intervention.  The patient's history has been reviewed, patient examined, no change in status, stable for surgery.  I have reviewed the patient's chart and labs.  Questions were answered to the patient's satisfaction.     Wilkie Aye

## 2021-06-03 NOTE — Anesthesia Preprocedure Evaluation (Signed)
Anesthesia Evaluation  Patient identified by MRN, date of birth, ID band Patient awake    Reviewed: Allergy & Precautions, H&P , NPO status , Patient's Chart, lab work & pertinent test results, reviewed documented beta blocker date and time   Airway Mallampati: II  TM Distance: >3 FB Neck ROM: full    Dental no notable dental hx.    Pulmonary neg pulmonary ROS, Current Smoker,    Pulmonary exam normal breath sounds clear to auscultation       Cardiovascular Exercise Tolerance: Good negative cardio ROS   Rhythm:regular Rate:Normal     Neuro/Psych negative neurological ROS  negative psych ROS   GI/Hepatic negative GI ROS, Neg liver ROS,   Endo/Other  negative endocrine ROS  Renal/GU negative Renal ROS  negative genitourinary   Musculoskeletal   Abdominal   Peds  Hematology negative hematology ROS (+)   Anesthesia Other Findings   Reproductive/Obstetrics negative OB ROS                             Anesthesia Physical Anesthesia Plan  ASA: 2  Anesthesia Plan: General and General LMA   Post-op Pain Management:    Induction:   PONV Risk Score and Plan: Ondansetron  Airway Management Planned:   Additional Equipment:   Intra-op Plan:   Post-operative Plan:   Informed Consent: I have reviewed the patients History and Physical, chart, labs and discussed the procedure including the risks, benefits and alternatives for the proposed anesthesia with the patient or authorized representative who has indicated his/her understanding and acceptance.     Dental Advisory Given  Plan Discussed with: CRNA  Anesthesia Plan Comments:         Anesthesia Quick Evaluation

## 2021-06-03 NOTE — Anesthesia Procedure Notes (Signed)
Procedure Name: LMA Insertion Date/Time: 06/03/2021 8:00 AM Performed by: Lorin Glass, CRNA Pre-anesthesia Checklist: Patient identified, Emergency Drugs available, Suction available and Patient being monitored Patient Re-evaluated:Patient Re-evaluated prior to induction Oxygen Delivery Method: Circle system utilized Preoxygenation: Pre-oxygenation with 100% oxygen Induction Type: IV induction LMA: LMA inserted LMA Size: 4.0 Number of attempts: 1 Placement Confirmation: positive ETCO2 and breath sounds checked- equal and bilateral Tube secured with: Tape Dental Injury: Teeth and Oropharynx as per pre-operative assessment

## 2021-06-04 ENCOUNTER — Encounter (HOSPITAL_COMMUNITY): Payer: Self-pay | Admitting: Urology

## 2021-06-04 NOTE — Anesthesia Postprocedure Evaluation (Signed)
Anesthesia Post Note  Patient: Joshua Blevins  Procedure(s) Performed: CYSTOSCOPY WITH RIGHT RETROGRADE PYELOGRAM (Right) HOLMIUM LASER APPLICATION RIGHT URETERAL CALCULUS WITH STONE BASKET EXTRACTION CYSTOSCOPY WITH  RIGHT URETERAL STENT PLACEMENT (Right)  Patient location during evaluation: Phase II Anesthesia Type: General Level of consciousness: awake Pain management: pain level controlled Vital Signs Assessment: post-procedure vital signs reviewed and stable Respiratory status: spontaneous breathing and respiratory function stable Cardiovascular status: blood pressure returned to baseline and stable Postop Assessment: no headache and no apparent nausea or vomiting Anesthetic complications: no Comments: Late entry   No notable events documented.   Last Vitals:  Vitals:   06/03/21 0915 06/03/21 0940  BP: 125/74 131/89  Pulse: 82 95  Resp: (!) 21 20  Temp:  36.9 C  SpO2: 98% 99%    Last Pain:  Vitals:   06/03/21 0940  TempSrc: Oral  PainSc: 7                  Windell Norfolk

## 2021-06-07 ENCOUNTER — Telehealth: Payer: Self-pay

## 2021-06-07 ENCOUNTER — Other Ambulatory Visit: Payer: Self-pay | Admitting: Urology

## 2021-06-07 NOTE — Telephone Encounter (Signed)
Patient called to ask for a pain medication refill. Patient pulled tethered stent this past Saturday. Patient still having flank pain.

## 2021-06-09 ENCOUNTER — Encounter: Payer: Self-pay | Admitting: Urology

## 2021-06-09 ENCOUNTER — Other Ambulatory Visit: Payer: Self-pay

## 2021-06-09 ENCOUNTER — Ambulatory Visit (INDEPENDENT_AMBULATORY_CARE_PROVIDER_SITE_OTHER): Payer: Self-pay | Admitting: Urology

## 2021-06-09 VITALS — BP 112/68 | HR 94

## 2021-06-09 DIAGNOSIS — N210 Calculus in bladder: Secondary | ICD-10-CM

## 2021-06-09 DIAGNOSIS — N200 Calculus of kidney: Secondary | ICD-10-CM

## 2021-06-09 LAB — URINALYSIS, ROUTINE W REFLEX MICROSCOPIC
Bilirubin, UA: NEGATIVE
Glucose, UA: NEGATIVE
Nitrite, UA: NEGATIVE
Specific Gravity, UA: >1.03 — ABNORMAL HIGH (ref 1.005–1.030)
Urobilinogen, Ur: 1 mg/dL (ref 0.2–1.0)
pH, UA: 6 (ref 5.0–7.5)

## 2021-06-09 LAB — CALCULI, WITH PHOTOGRAPH (CLINICAL LAB)
Calcium Oxalate Dihydrate: 80 %
Calcium Oxalate Monohydrate: 20 %
Weight Calculi: 37 mg

## 2021-06-09 LAB — MICROSCOPIC EXAMINATION
Bacteria, UA: NONE SEEN
RBC, Urine: >30 /hpf — AB (ref 0–2)
Renal Epithel, UA: NONE SEEN /hpf

## 2021-06-09 MED ORDER — OXYCODONE-ACETAMINOPHEN 5-325 MG PO TABS: 1.0000 | ORAL_TABLET | ORAL | 0 refills | Status: AC | PRN

## 2021-06-09 NOTE — Patient Instructions (Signed)
Dietary Guidelines to Help Prevent Kidney Stones Kidney stones are deposits of minerals and salts that form inside your kidneys. Your risk of developing kidney stones may be greater depending on your diet, your lifestyle, the medicines you take, and whether you have certain medical conditions. Most people can lower their chances of developing kidney stones by following the instructions below. Your dietitian may give you more specific instructions depending on your overall health and the type of kidney stones you tend to develop. What are tips for following this plan? Reading food labels  Choose foods with "no salt added" or "low-salt" labels. Limit your salt (sodium) intake to less than 1,500 mg a day. Choose foods with calcium for each meal and snack. Try to eat about 300 mg of calcium at each meal. Foods that contain 200-500 mg of calcium a serving include: 8 oz (237 mL) of milk, calcium-fortifiednon-dairy milk, and calcium-fortifiedfruit juice. Calcium-fortified means that calcium has been added to these drinks. 8 oz (237 mL) of kefir, yogurt, and soy yogurt. 4 oz (114 g) of tofu. 1 oz (28 g) of cheese. 1 cup (150 g) of dried figs. 1 cup (91 g) of cooked broccoli. One 3 oz (85 g) can of sardines or mackerel. Most people need 1,000-1,500 mg of calcium a day. Talk to your dietitian about how much calcium is recommended for you. Shopping Buy plenty of fresh fruits and vegetables. Most people do not need to avoid fruits and vegetables, even if these foods contain nutrients that may contribute to kidney stones. When shopping for convenience foods, choose: Whole pieces of fruit. Pre-made salads with dressing on the side. Low-fat fruit and yogurt smoothies. Avoid buying frozen meals or prepared deli foods. These can be high in sodium. Look for foods with live cultures, such as yogurt and kefir. Choose high-fiber grains, such as whole-wheat breads, oat bran, and wheat cereals. Cooking Do not add  salt to food when cooking. Place a salt shaker on the table and allow each person to add his or her own salt to taste. Use vegetable protein, such as beans, textured vegetable protein (TVP), or tofu, instead of meat in pasta, casseroles, and soups. Meal planning Eat less salt, if told by your dietitian. To do this: Avoid eating processed or pre-made food. Avoid eating fast food. Eat less animal protein, including cheese, meat, poultry, or fish, if told by your dietitian. To do this: Limit the number of times you have meat, poultry, fish, or cheese each week. Eat a diet free of meat at least 2 days a week. Eat only one serving each day of meat, poultry, fish, or seafood. When you prepare animal protein, cut pieces into small portion sizes. For most meat and fish, one serving is about the size of the palm of your hand. Eat at least five servings of fresh fruits and vegetables each day. To do this: Keep fruits and vegetables on hand for snacks. Eat one piece of fruit or a handful of berries with breakfast. Have a salad and fruit at lunch. Have two kinds of vegetables at dinner. Limit foods that are high in a substance called oxalate. These include: Spinach (cooked), rhubarb, beets, sweet potatoes, and Swiss chard. Peanuts. Potato chips, french fries, and baked potatoes with skin on. Nuts and nut products. Chocolate. If you regularly take a diuretic medicine, make sure to eat at least 1 or 2 servings of fruits or vegetables that are high in potassium each day. These include: Avocado. Banana. Orange, prune,   carrot, or tomato juice. Baked potato. Cabbage. Beans and split peas. Lifestyle  Drink enough fluid to keep your urine pale yellow. This is the most important thing you can do. Spread your fluid intake throughout the day. If you drink alcohol: Limit how much you use to: 0-1 drink a day for women who are not pregnant. 0-2 drinks a day for men. Be aware of how much alcohol is in your  drink. In the [place] one drink equals one 12 oz bottle of beer (355 mL), one 5 oz glass of wine (148 mL), or one 1 oz glass of hard liquor (44 mL). Lose weight if told by your health care provider. Work with your dietitian to find an eating plan and weight loss strategies that work best for you. General information Talk to your health care provider and dietitian about taking daily supplements. You may be told the following depending on your health and the cause of your kidney stones: Not to take supplements with vitamin C. To take a calcium supplement. To take a daily probiotic supplement. To take other supplements such as magnesium, fish oil, or vitamin B6. Take over-the-counter and prescription medicines only as told by your health care provider. These include supplements. What foods should I limit? Limit your intake of the following foods, or eat them as told by your dietitian. Vegetables Spinach. Rhubarb. Beets. Canned vegetables. Pickles. Olives. Baked potatoes with skin. Grains Wheat bran. Baked goods. Salted crackers. Cereals high in sugar. Meats and other proteins Nuts. Nut butters. Large portions of meat, poultry, or fish. Salted, precooked, or cured meats, such as sausages, meat loaves, and hot dogs. Dairy Cheese. Beverages Regular soft drinks. Regular vegetable juice. Seasonings and condiments Seasoning blends with salt. Salad dressings. Soy sauce. Ketchup. Barbecue sauce. Other foods Canned soups. Canned pasta sauce. Casseroles. Pizza. Lasagna. Frozen meals. Potato chips. French fries. The items listed above may not be a complete list of foods and beverages you should limit. Contact a dietitian for more information. What foods should I avoid? Talk to your dietitian about specific foods you should avoid based on the type of kidney stones you have and your overall health. Fruits Grapefruit. The item listed above may not be a complete list of foods and beverages you should  avoid. Contact a dietitian for more information. Summary Kidney stones are deposits of minerals and salts that form inside your kidneys. You can lower your risk of kidney stones by making changes to your diet. The most important thing you can do is drink enough fluid. Drink enough fluid to keep your urine pale yellow. Talk to your dietitian about how much calcium you should have each day, and eat less salt and animal protein as told by your dietitian. This information is not intended to replace advice given to you by your health care provider. Make sure you discuss any questions you have with your health care provider. Document Revised: 07/11/2019 Document Reviewed: 07/11/2019 Elsevier Patient Education  2022 Elsevier Inc.  

## 2021-06-09 NOTE — Progress Notes (Signed)
Urological Symptom Review  Patient is experiencing the following symptoms: Hard to postpone urination Blood in urine   Review of Systems  Gastrointestinal (upper)  : Negative for upper GI symptoms  Gastrointestinal (lower) : Constipation  Constitutional : Fatigue  Skin: Negative for skin symptoms  Eyes: Negative for eye symptoms  Ear/Nose/Throat : Negative for Ear/Nose/Throat symptoms  Hematologic/Lymphatic: Negative for Hematologic/Lymphatic symptoms  Cardiovascular : Negative for cardiovascular symptoms  Respiratory : Negative for respiratory symptoms  Endocrine: Negative for endocrine symptoms  Musculoskeletal: Back pain Joint pain  Neurological: Negative for neurological symptoms  Psychologic: Negative for psychiatric symptoms
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
## 2021-06-15 ENCOUNTER — Encounter: Payer: Self-pay | Admitting: Urology

## 2021-06-15 ENCOUNTER — Other Ambulatory Visit: Payer: Self-pay | Admitting: Urology

## 2021-06-15 NOTE — Telephone Encounter (Signed)
Please advise 
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
## 2021-07-20 ENCOUNTER — Ambulatory Visit (HOSPITAL_COMMUNITY): Admission: RE | Admit: 2021-07-20 | Payer: Self-pay | Source: Ambulatory Visit

## 2021-07-21 ENCOUNTER — Ambulatory Visit: Payer: Self-pay | Admitting: Urology

## 2021-07-23 ENCOUNTER — Ambulatory Visit (HOSPITAL_COMMUNITY): Admission: RE | Admit: 2021-07-23 | Payer: Self-pay | Source: Ambulatory Visit

## 2021-07-23 ENCOUNTER — Encounter (HOSPITAL_COMMUNITY): Payer: Self-pay

## 2021-07-27 ENCOUNTER — Telehealth: Payer: Self-pay | Admitting: Urology

## 2021-07-27 ENCOUNTER — Other Ambulatory Visit: Payer: Self-pay

## 2021-07-27 DIAGNOSIS — N200 Calculus of kidney: Secondary | ICD-10-CM

## 2021-09-08 ENCOUNTER — Ambulatory Visit: Payer: Self-pay | Admitting: Urology

## 2021-09-14 ENCOUNTER — Ambulatory Visit (HOSPITAL_COMMUNITY): Admission: RE | Admit: 2021-09-14 | Payer: Self-pay | Source: Ambulatory Visit

## 2023-07-20 ENCOUNTER — Other Ambulatory Visit: Payer: Self-pay

## 2023-07-20 ENCOUNTER — Encounter (HOSPITAL_COMMUNITY): Payer: Self-pay

## 2023-07-20 ENCOUNTER — Emergency Department (HOSPITAL_COMMUNITY)
Admission: EM | Admit: 2023-07-20 | Discharge: 2023-07-20 | Disposition: A | Discharge status: Home / Self Care | Payer: Self-pay | Attending: Emergency Medicine | Admitting: Emergency Medicine

## 2023-07-20 ENCOUNTER — Emergency Department (HOSPITAL_COMMUNITY): Payer: Self-pay

## 2023-07-20 DIAGNOSIS — R300 Dysuria: Secondary | ICD-10-CM | POA: Insufficient documentation

## 2023-07-20 DIAGNOSIS — R369 Urethral discharge, unspecified: Secondary | ICD-10-CM | POA: Insufficient documentation

## 2023-07-20 LAB — URINALYSIS, W/ REFLEX TO CULTURE (INFECTION SUSPECTED)
Bilirubin Urine: NEGATIVE
Glucose, UA: NEGATIVE mg/dL
Ketones, ur: NEGATIVE mg/dL
Nitrite: NEGATIVE
Protein, ur: 30 mg/dL — AB
Specific Gravity, Urine: 1.026 (ref 1.005–1.030)
WBC, UA: >50 WBC/hpf (ref 0–5)
pH: 5 (ref 5.0–8.0)

## 2023-07-20 LAB — HIV ANTIBODY (ROUTINE TESTING W REFLEX): HIV Screen 4th Generation wRfx: NONREACTIVE

## 2023-07-20 MED ORDER — CEFTRIAXONE SODIUM 500 MG IJ SOLR
500.0000 mg | Freq: Once | INTRAMUSCULAR | Status: AC
  Administered 2023-07-20: 500 mg via INTRAMUSCULAR
  Filled 2023-07-20: qty 500

## 2023-07-20 MED ORDER — LIDOCAINE HCL (PF) 1 % IJ SOLN
INTRAMUSCULAR | Status: AC
  Filled 2023-07-20: qty 30

## 2023-07-20 MED ORDER — DOXYCYCLINE HYCLATE 100 MG PO TABS
100.0000 mg | ORAL_TABLET | Freq: Once | ORAL | Status: AC
  Administered 2023-07-20: 100 mg via ORAL
  Filled 2023-07-20: qty 1

## 2023-07-20 MED ORDER — DOXYCYCLINE HYCLATE 100 MG PO CAPS: 100.0000 mg | ORAL_CAPSULE | Freq: Two times a day (BID) | ORAL | 0 refills | Status: DC

## 2023-07-20 NOTE — ED Provider Notes (Signed)
[place] EMERGENCY DEPARTMENT AT [facility] Provider Note   CSN: [id number] Arrival date & time: 07/20/23  4401     History  Chief Complaint  Patient presents with   Dysuria    Joshua Blevins is a 42 y.o. male.  42 year old male with a history of STD and kidney stones presents ER today with dysuria.  Patient states that he does not have any pain like he did with his kidney stone however he does have some pain with urination like he did with his kidney stone.  Seems to be mostly after the end of his urination.  No fevers.  Is engaging in unprotected intercourse.  With females only.  No fevers.  Unsure about discharge however has noticed some abnormal coloring to the front of his boxers.   Dysuria Presenting symptoms: dysuria and penile discharge        Home Medications Prior to Admission medications   Medication Sig Start Date End Date Taking? Authorizing Provider  doxycycline (VIBRAMYCIN) 100 MG capsule Take 1 capsule (100 mg total) by mouth 2 (two) times daily. One po bid x 7 days 07/20/23  Yes Shafin Pollio, Barbara Cower, MD      Allergies    Patient has no known allergies.    Review of Systems   Review of Systems  Genitourinary:  Positive for dysuria and penile discharge.    Physical Exam Updated Vital Signs BP (!) 140/97   Pulse 93   Temp 99.2 F (37.3 C)   Resp 18   Ht 5\' 7"  (1.702 m)   Wt 81.6 kg   SpO2 95%   BMI 28.19 kg/m  Physical Exam Vitals and nursing note reviewed.  Constitutional:      Appearance: He is well-developed.  HENT:     Head: Normocephalic and atraumatic.  Cardiovascular:     Rate and Rhythm: Normal rate.  Pulmonary:     Effort: Pulmonary effort is normal. No respiratory distress.  Abdominal:     General: There is no distension.  Genitourinary:    Comments: Chaperoned by nurse Jeanella Anton, patient with no evidence of urethritis but does have some discharge expressed from his penis.  No rashes on the skin around his penis,  scrotum Musculoskeletal:        General: Normal range of motion.     Cervical back: Normal range of motion.  Neurological:     Mental Status: He is alert.     ED Results / Procedures / Treatments   Labs (all labs ordered are listed, but only abnormal results are displayed) Labs Reviewed  URINALYSIS, W/ REFLEX TO CULTURE (INFECTION SUSPECTED) - Abnormal; Notable for the following components:      Result Value   APPearance HAZY (*)    Hgb urine dipstick LARGE (*)    Protein, ur 30 (*)    Leukocytes,Ua MODERATE (*)    Bacteria, UA RARE (*)    All other components within normal limits  URINE CULTURE  HIV ANTIBODY (ROUTINE TESTING W REFLEX)  GC/CHLAMYDIA PROBE AMP ([place]) NOT AT [facility]    EKG None  Radiology CT Renal Stone Study Result Date: 07/20/2023 CLINICAL DATA:  Abdominal/flank pain with stone suspected. Concern for stone or STD. EXAM: CT ABDOMEN AND PELVIS WITHOUT CONTRAST TECHNIQUE: Multidetector CT imaging of the abdomen and pelvis was performed following the standard protocol without IV contrast. RADIATION DOSE REDUCTION: This exam was performed according to the departmental dose-optimization program which includes automated exposure control, adjustment of the mA  and/or kV according to patient size and/or use of iterative reconstruction technique. COMPARISON:  Abdominal CT 05/10/2021 FINDINGS: Lower chest: Subpleural nodules along the peripheral lower lobes and right minor fissure are stable from 2022 and attributed to lymph nodes. Hepatobiliary: No focal liver abnormality.No evidence of biliary obstruction or stone. Pancreas: Unremarkable. Spleen: Unremarkable. Adrenals/Urinary Tract: Negative adrenals. No hydronephrosis or stone. Unremarkable bladder. Stomach/Bowel:  No obstruction. No appendicitis. Vascular/Lymphatic: No acute vascular abnormality. No mass or adenopathy. Reproductive:No pathologic findings. Other: No ascites or pneumoperitoneum. Musculoskeletal: No acute  abnormalities. IMPRESSION: No acute finding.  No hydronephrosis or nephrolithiasis. Electronically Signed   By: Tiburcio Pea M.D.   On: 07/20/2023 05:23    Procedures Procedures    Medications Ordered in ED Medications  lidocaine (PF) (XYLOCAINE) 1 % injection (  Not Given 07/20/23 0345)  cefTRIAXone (ROCEPHIN) injection 500 mg (500 mg Intramuscular Given 07/20/23 0344)  doxycycline (VIBRA-TABS) tablet 100 mg (100 mg Oral Given 07/20/23 0344)    ED Course/ Medical Decision Making/ A&P                                 Medical Decision Making Amount and/or Complexity of Data Reviewed Labs: ordered. Radiology: ordered.  Risk Prescription drug management.   Likely STD.  CT scan reviewed by myself without any evidence of kidney stone or deeper infection.  Radiology read reviewed.  Will going treat prophylactically pending cultures.  Final Clinical Impression(s) / ED Diagnoses Final diagnoses:  Penile discharge  Dysuria    Rx / DC Orders ED Discharge Orders          Ordered    doxycycline (VIBRAMYCIN) 100 MG capsule  2 times daily        07/20/23 0527              Para Cossey, Barbara Cower, MD 07/20/23 0530

## 2023-07-20 NOTE — ED Triage Notes (Signed)
Pov from home.  Cc of painful urination for a couple days. Concerns for either a kidney stone or STD. Thinks its probably an STD

## 2023-07-20 NOTE — Discharge Instructions (Addendum)
I suspect you have a STD based on the penile discharge and pain with urination and unprotected intercourse.  Please refrain from any intercourse until you are tested again for cure after finishing your treatment.  Any worsening changes please come back.  Please see the health department or your PCP for retest after antibiotics.

## 2023-07-21 LAB — GC/CHLAMYDIA PROBE AMP (~~LOC~~) NOT AT ARMC
Chlamydia: NEGATIVE
Comment: NEGATIVE
Comment: NORMAL
Neisseria Gonorrhea: POSITIVE — AB

## 2023-07-21 LAB — URINE CULTURE: Culture: NO GROWTH

## 2024-02-09 ENCOUNTER — Emergency Department (HOSPITAL_COMMUNITY)

## 2024-02-09 ENCOUNTER — Emergency Department (HOSPITAL_COMMUNITY)
Admission: EM | Admit: 2024-02-09 | Discharge: 2024-02-09 | Disposition: A | Discharge status: Home / Self Care | Attending: Emergency Medicine | Admitting: Emergency Medicine

## 2024-02-09 ENCOUNTER — Other Ambulatory Visit: Payer: Self-pay

## 2024-02-09 ENCOUNTER — Encounter (HOSPITAL_COMMUNITY): Payer: Self-pay | Admitting: Emergency Medicine

## 2024-02-09 DIAGNOSIS — F17210 Nicotine dependence, cigarettes, uncomplicated: Secondary | ICD-10-CM | POA: Insufficient documentation

## 2024-02-09 DIAGNOSIS — R319 Hematuria, unspecified: Secondary | ICD-10-CM | POA: Insufficient documentation

## 2024-02-09 DIAGNOSIS — R109 Unspecified abdominal pain: Secondary | ICD-10-CM | POA: Diagnosis present

## 2024-02-09 LAB — URINALYSIS, ROUTINE W REFLEX MICROSCOPIC
Bilirubin Urine: NEGATIVE
Glucose, UA: NEGATIVE mg/dL
Ketones, ur: NEGATIVE mg/dL
Nitrite: NEGATIVE
Protein, ur: 100 mg/dL — AB
RBC / HPF: >50 RBC/hpf (ref 0–5)
Specific Gravity, Urine: 1.035 — ABNORMAL HIGH (ref 1.005–1.030)
pH: 5 (ref 5.0–8.0)

## 2024-02-09 LAB — BASIC METABOLIC PANEL WITH GFR
Anion gap: 13 (ref 5–15)
BUN: 16 mg/dL (ref 6–20)
CO2: 24 mmol/L (ref 22–32)
Calcium: 9.4 mg/dL (ref 8.9–10.3)
Chloride: 104 mmol/L (ref 98–111)
Creatinine, Ser: 1.19 mg/dL (ref 0.61–1.24)
GFR, Estimated: >60 mL/min (ref >60)
Glucose, Bld: 103 mg/dL — ABNORMAL HIGH (ref 70–99)
Potassium: 4 mmol/L (ref 3.5–5.1)
Sodium: 141 mmol/L (ref 135–145)

## 2024-02-09 LAB — CBC
HCT: 53.8 % — ABNORMAL HIGH (ref 39.0–52.0)
Hemoglobin: 17.7 g/dL — ABNORMAL HIGH (ref 13.0–17.0)
MCH: 22.1 pg — ABNORMAL LOW (ref 26.0–34.0)
MCHC: 32.9 g/dL (ref 30.0–36.0)
MCV: 67.3 fL — ABNORMAL LOW (ref 80.0–100.0)
Platelets: 338 K/uL (ref 150–400)
RBC: 8 MIL/uL — ABNORMAL HIGH (ref 4.22–5.81)
RDW: 18.3 % — ABNORMAL HIGH (ref 11.5–15.5)
WBC: 7.1 K/uL (ref 4.0–10.5)
nRBC: 0 % (ref 0.0–0.2)

## 2024-02-09 MED ORDER — NAPROXEN 500 MG PO TABS: 500.0000 mg | ORAL_TABLET | Freq: Two times a day (BID) | ORAL | 0 refills | Status: DC

## 2024-02-09 MED ORDER — CEPHALEXIN 500 MG PO CAPS
500.0000 mg | ORAL_CAPSULE | Freq: Once | ORAL | Status: AC
  Administered 2024-02-09: 500 mg via ORAL
  Filled 2024-02-09: qty 1

## 2024-02-09 MED ORDER — NAPROXEN 250 MG PO TABS
500.0000 mg | ORAL_TABLET | Freq: Once | ORAL | Status: AC
  Administered 2024-02-09: 500 mg via ORAL
  Filled 2024-02-09: qty 2

## 2024-02-09 MED ORDER — OXYCODONE HCL 5 MG PO TABS
5.0000 mg | ORAL_TABLET | Freq: Once | ORAL | Status: AC
  Administered 2024-02-09: 5 mg via ORAL
  Filled 2024-02-09: qty 1

## 2024-02-09 MED ORDER — CEPHALEXIN 500 MG PO CAPS: 500.0000 mg | ORAL_CAPSULE | Freq: Four times a day (QID) | ORAL | 0 refills | Status: AC

## 2024-02-09 MED ORDER — CEPHALEXIN 500 MG PO CAPS: 500.0000 mg | ORAL_CAPSULE | Freq: Four times a day (QID) | ORAL | 0 refills | Status: DC

## 2024-02-09 MED ORDER — NAPROXEN 500 MG PO TABS
500.0000 mg | ORAL_TABLET | Freq: Two times a day (BID) | ORAL | 0 refills | Status: AC
Start: 2024-02-09 — End: ?

## 2024-02-09 NOTE — ED Notes (Signed)
 Brought in by [employer] from [facility]
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
## 2024-02-09 NOTE — Discharge Instructions (Signed)
 You were evaluated in the Emergency Department and after careful evaluation, we did not find any emergent condition requiring admission or further testing in the hospital.  Your exam/testing today is overall reassuring.  Symptoms may be due to a kidney infection.  Take the Keflex  antibiotic as directed.  Use the Naprosyn  anti-inflammatory twice daily for pain.  Please return to the Emergency Department if you experience any worsening of your condition.   Thank you for allowing us  to be a part of your care.

## 2024-02-15 ENCOUNTER — Emergency Department (HOSPITAL_COMMUNITY)

## 2024-02-15 ENCOUNTER — Emergency Department (HOSPITAL_COMMUNITY)
Admission: EM | Admit: 2024-02-15 | Discharge: 2024-02-16 | Disposition: A | Discharge status: Home / Self Care | Attending: Emergency Medicine | Admitting: Emergency Medicine

## 2024-02-15 ENCOUNTER — Encounter (HOSPITAL_COMMUNITY): Payer: Self-pay | Admitting: Emergency Medicine

## 2024-02-15 ENCOUNTER — Other Ambulatory Visit: Payer: Self-pay

## 2024-02-15 DIAGNOSIS — K625 Hemorrhage of anus and rectum: Secondary | ICD-10-CM | POA: Insufficient documentation

## 2024-02-15 DIAGNOSIS — K5900 Constipation, unspecified: Secondary | ICD-10-CM | POA: Diagnosis not present

## 2024-02-15 DIAGNOSIS — R109 Unspecified abdominal pain: Secondary | ICD-10-CM | POA: Insufficient documentation

## 2024-02-15 LAB — COMPREHENSIVE METABOLIC PANEL WITH GFR
ALT: 13 U/L (ref 0–44)
AST: 15 U/L (ref 15–41)
Albumin: 4.1 g/dL (ref 3.5–5.0)
Alkaline Phosphatase: 75 U/L (ref 38–126)
Anion gap: 12 (ref 5–15)
BUN: 17 mg/dL (ref 6–20)
CO2: 22 mmol/L (ref 22–32)
Calcium: 9.1 mg/dL (ref 8.9–10.3)
Chloride: 104 mmol/L (ref 98–111)
Creatinine, Ser: 0.89 mg/dL (ref 0.61–1.24)
GFR, Estimated: >60 mL/min (ref >60)
Glucose, Bld: 97 mg/dL (ref 70–99)
Potassium: 3.8 mmol/L (ref 3.5–5.1)
Sodium: 138 mmol/L (ref 135–145)
Total Bilirubin: 0.7 mg/dL (ref 0.0–1.2)
Total Protein: 7.7 g/dL (ref 6.5–8.1)

## 2024-02-15 LAB — CBC WITH DIFFERENTIAL/PLATELET
Abs Immature Granulocytes: 0.02 K/uL (ref 0.00–0.07)
Basophils Absolute: 0.1 K/uL (ref 0.0–0.1)
Basophils Relative: 1 %
Eosinophils Absolute: 0.1 K/uL (ref 0.0–0.5)
Eosinophils Relative: 1 %
HCT: 51.1 % (ref 39.0–52.0)
Hemoglobin: 16.8 g/dL (ref 13.0–17.0)
Immature Granulocytes: 0 %
Lymphocytes Relative: 52 %
Lymphs Abs: 4.3 K/uL — ABNORMAL HIGH (ref 0.7–4.0)
MCH: 22 pg — ABNORMAL LOW (ref 26.0–34.0)
MCHC: 32.9 g/dL (ref 30.0–36.0)
MCV: 67.1 fL — ABNORMAL LOW (ref 80.0–100.0)
Monocytes Absolute: 0.9 K/uL (ref 0.1–1.0)
Monocytes Relative: 11 %
Neutro Abs: 2.9 K/uL (ref 1.7–7.7)
Neutrophils Relative %: 35 %
Platelets: 342 K/uL (ref 150–400)
RBC: 7.62 MIL/uL — ABNORMAL HIGH (ref 4.22–5.81)
RDW: 17.4 % — ABNORMAL HIGH (ref 11.5–15.5)
WBC: 8.2 K/uL (ref 4.0–10.5)
nRBC: 0 % (ref 0.0–0.2)

## 2024-02-15 LAB — LIPASE, BLOOD: Lipase: 43 U/L (ref 11–51)

## 2024-02-15 MED ORDER — IOHEXOL 300 MG/ML  SOLN
100.0000 mL | Freq: Once | INTRAMUSCULAR | Status: AC | PRN
  Administered 2024-02-15: 100 mL via INTRAVENOUS

## 2024-02-15 NOTE — ED Triage Notes (Signed)
 [facility] and last BM was x 2 weeks ago.

## 2024-02-15 NOTE — ED Notes (Signed)
 Patient transported to CT

## 2024-02-16 MED ORDER — METAMUCIL SMOOTH TEXTURE 58.6 % PO POWD: 1.0000 | Freq: Three times a day (TID) | ORAL | 12 refills | Status: AC

## 2024-02-16 MED ORDER — DOCUSATE SODIUM 100 MG PO CAPS: 100.0000 mg | ORAL_CAPSULE | Freq: Two times a day (BID) | ORAL | 0 refills | Status: AC

## 2024-02-16 MED ORDER — MAGNESIUM CITRATE PO SOLN
1.0000 | Freq: Once | ORAL | 0 refills | Status: AC
Start: 2024-02-16 — End: 2024-02-16

## 2024-02-16 NOTE — Discharge Instructions (Signed)
 Magnesium citrate: Drink the entire 10 ounce bottle for relief of constipation.  Begin taking Metamucil, 1 heaping teaspoon in a glass of water  3 times daily.  Begin taking Colace (equal 8 stool softener) 100 mg twice daily.  All of these medications are available over-the-counter.

## 2024-02-16 NOTE — ED Provider Notes (Signed)
 [place] EMERGENCY DEPARTMENT AT [facility] Provider Note   CSN: [id number] Arrival date & time: 02/15/24  2156     Patient presents with: Rectal Bleeding   Joshua Blevins is a 43 y.o. male.   Patient is a 43 year old male with no significant past medical history.  Patient presenting with abdominal discomfort, constipation, and bright red blood per rectum.  He states he has not had a significant bowel movement in 2 weeks.  Today he was straining to go to the bathroom and developed bright red blood from his rectum.  He denies any fevers or chills.  No vomiting or diarrhea.       Prior to Admission medications   Medication Sig Start Date End Date Taking? Authorizing Provider  cephALEXin  (KEFLEX ) 500 MG capsule Take 1 capsule (500 mg total) by mouth 4 (four) times daily for 10 days. 02/09/24 02/19/24  Theadore Ozell HERO, MD  doxycycline  (VIBRAMYCIN ) 100 MG capsule Take 1 capsule (100 mg total) by mouth 2 (two) times daily. One po bid x 7 days 07/20/23   Mesner, Selinda, MD  naproxen  (NAPROSYN ) 500 MG tablet Take 1 tablet (500 mg total) by mouth 2 (two) times daily. 02/09/24   Theadore Ozell HERO, MD    Allergies: Patient has no known allergies.    Review of Systems  All other systems reviewed and are negative.   Updated Vital Signs BP (!) 127/93   Pulse 79   Temp 98.4 F (36.9 C)   Resp 18   SpO2 99%   Physical Exam Vitals and nursing note reviewed.  Constitutional:      General: He is not in acute distress.    Appearance: Normal appearance. He is well-developed. He is not diaphoretic.  HENT:     Head: Normocephalic and atraumatic.  Cardiovascular:     Rate and Rhythm: Normal rate and regular rhythm.     Heart sounds: No murmur heard.    No friction rub.  Pulmonary:     Effort: Pulmonary effort is normal. No respiratory distress.     Breath sounds: Normal breath sounds. No wheezing or rales.  Abdominal:     General: Bowel sounds are normal. There is no  distension.     Palpations: Abdomen is soft.     Tenderness: There is no abdominal tenderness.  Genitourinary:    Rectum: Normal.  Musculoskeletal:        General: Normal range of motion.     Cervical back: Normal range of motion and neck supple.  Skin:    General: Skin is warm and dry.  Neurological:     Mental Status: He is alert and oriented to person, place, and time.     Coordination: Coordination normal.     (all labs ordered are listed, but only abnormal results are displayed) Labs Reviewed  CBC WITH DIFFERENTIAL/PLATELET - Abnormal; Notable for the following components:      Result Value   RBC 7.62 (*)    MCV 67.1 (*)    MCH 22.0 (*)    RDW 17.4 (*)    Lymphs Abs 4.3 (*)    All other components within normal limits  COMPREHENSIVE METABOLIC PANEL WITH GFR  LIPASE, BLOOD  URINALYSIS, ROUTINE W REFLEX MICROSCOPIC    EKG: None  Radiology: CT ABDOMEN PELVIS W CONTRAST Result Date: 02/15/2024 CLINICAL DATA:  Acute abdominal pain and history of rectal bleeding EXAM: CT ABDOMEN AND PELVIS WITH CONTRAST TECHNIQUE: Multidetector CT imaging of the abdomen and  pelvis was performed using the standard protocol following bolus administration of intravenous contrast. RADIATION DOSE REDUCTION: This exam was performed according to the departmental dose-optimization program which includes automated exposure control, adjustment of the mA and/or kV according to patient size and/or use of iterative reconstruction technique. CONTRAST:  OMNIPAQUE  IOHEXOL  300 MG/ML  SOLN COMPARISON:  02/09/2024 FINDINGS: Lower chest: Lung bases are free of acute infiltrate or sizable effusion. Stable nodular changes are again seen. Follow-up as previously described. Hepatobiliary: No focal liver abnormality is seen. No gallstones, gallbladder wall thickening, or biliary dilatation. Pancreas: Unremarkable. No pancreatic ductal dilatation or surrounding inflammatory changes. Spleen: Normal in size without  focal abnormality. Adrenals/Urinary Tract: Adrenal glands are within normal limits. Kidneys demonstrate a normal enhancement pattern bilaterally. No renal calculi or obstructive changes are seen. The bladder is within normal limits. Stomach/Bowel: Scattered fecal material is noted throughout the colon without obstructive change. The appendix is within normal limits. Stomach is within normal limits. Some mild wall thickening is noted within the proximal jejunal loops which may represent some mild enteritis. Vascular/Lymphatic: No significant vascular findings are present. No enlarged abdominal or pelvic lymph nodes. Reproductive: Prostate is unremarkable. Other: No abdominal wall hernia or abnormality. No abdominopelvic ascites. Musculoskeletal: No acute or significant osseous findings. IMPRESSION: Mild wall thickening within the proximal jejunum without definitive obstructive change. This may represent some mild enteritis. Nodular changes in the lung bases. Follow-up as previously described. Electronically Signed   By: Oneil Devonshire M.D.   On: 02/15/2024 23:49     Procedures   Medications Ordered in the ED  iohexol  (OMNIPAQUE ) 300 MG/ML solution 100 mL (100 mLs Intravenous Contrast Given 02/15/24 2322)                                    Medical Decision Making  Patient presenting here with complaints of abdominal pain and constipation for the past 2 weeks.  He also describes passing blood through his rectum this evening.  Patient arrives here with stable vital signs and is afebrile.  Physical examination reveals mild left-sided abdominal tenderness, but no peritoneal signs.  Laboratory studies obtained including CBC, lipase, and comprehensive metabolic panel, all of which are unremarkable.  CT scan of the abdomen and pelvis obtained showing no acute intra-abdominal process.  He does have mild wall thickening within the proximal jejunum without obstructive change which could represent some mild  enteritis.  There is no fecal impaction.  At this point, I feel as though the patient can safely be discharged.  I will recommend magnesium citrate followed by fiber supplementation and Colace.  Patient's rectal bleeding likely either a fissure or internal hemorrhoid.  His rectal examination is otherwise unremarkable with no external hemorrhoids.     Final diagnoses:  None    ED Discharge Orders     None          Geroldine Berg, MD 02/16/24 [phone]

## 2024-04-15 ENCOUNTER — Ambulatory Visit: Payer: Self-pay

## 2024-07-17 ENCOUNTER — Emergency Department (HOSPITAL_COMMUNITY)
Admission: EM | Admit: 2024-07-17 | Discharge: 2024-07-17 | Disposition: A | Discharge status: Home / Self Care | Payer: Self-pay | Source: Home / Self Care | Attending: Emergency Medicine | Admitting: Emergency Medicine

## 2024-07-17 ENCOUNTER — Other Ambulatory Visit: Payer: Self-pay

## 2024-07-17 ENCOUNTER — Encounter (HOSPITAL_COMMUNITY): Payer: Self-pay

## 2024-07-17 DIAGNOSIS — N342 Other urethritis: Secondary | ICD-10-CM | POA: Insufficient documentation

## 2024-07-17 LAB — URINALYSIS, ROUTINE W REFLEX MICROSCOPIC
Bacteria, UA: NONE SEEN
Bilirubin Urine: NEGATIVE
Glucose, UA: NEGATIVE mg/dL
Ketones, ur: NEGATIVE mg/dL
Nitrite: NEGATIVE
Protein, ur: 30 mg/dL — AB
Specific Gravity, Urine: 1.024 (ref 1.005–1.030)
WBC, UA: >50 WBC/hpf (ref 0–5)
pH: 5 (ref 5.0–8.0)

## 2024-07-17 MED ORDER — DOXYCYCLINE HYCLATE 100 MG PO TABS
100.0000 mg | ORAL_TABLET | Freq: Once | ORAL | Status: AC
  Administered 2024-07-17: 23:00:00 100 mg via ORAL
  Filled 2024-07-17: qty 1

## 2024-07-17 MED ORDER — DOXYCYCLINE HYCLATE 100 MG PO CAPS: 100.0000 mg | ORAL_CAPSULE | Freq: Two times a day (BID) | ORAL | 0 refills | Status: AC

## 2024-07-17 MED ORDER — CEFTRIAXONE SODIUM 500 MG IJ SOLR
500.0000 mg | Freq: Once | INTRAMUSCULAR | Status: AC
  Administered 2024-07-17: 23:00:00 500 mg via INTRAMUSCULAR
  Filled 2024-07-17: qty 500

## 2024-07-17 MED ORDER — LIDOCAINE HCL (PF) 1 % IJ SOLN
INTRAMUSCULAR | Status: AC
  Filled 2024-07-17: qty 5

## 2024-07-17 NOTE — ED Triage Notes (Signed)
 Pt arrives from home with reports of burning with urination states that it feels similar to kidney stones in the past also endorses unprotected sex. Denies any other symptoms.

## 2024-07-17 NOTE — ED Provider Notes (Signed)
 [place] EMERGENCY DEPARTMENT AT [facility] Provider Note   CSN: [id number] Arrival date & time: 07/17/24  2037     Patient presents with: Dysuria   Joshua Blevins is a 43 y.o. male.   HPI 43 year old male presents with dysuria.  For 3 days he has been having dysuria, worst at the end of urination.  He denies any abdominal or flank pain.  He states he might be urinating a little more than normal.  He has a history of kidney stones which he was concerned about though this does not really feel the same.  No vomiting.  No testicular pain.  He has had some mild penile discharge.  He also has been having unprotected intercourse with new partners and is concern for STI mostly.  Prior to Admission medications  Medication Sig Start Date End Date Taking? Authorizing Provider  doxycycline  (VIBRAMYCIN ) 100 MG capsule Take 1 capsule (100 mg total) by mouth 2 (two) times daily. One po bid x 7 days 07/17/24  Yes Freddi Hamilton, MD  docusate sodium  (COLACE) 100 MG capsule Take 1 capsule (100 mg total) by mouth every 12 (twelve) hours. 02/16/24   Geroldine Berg, MD  naproxen  (NAPROSYN ) 500 MG tablet Take 1 tablet (500 mg total) by mouth 2 (two) times daily. 02/09/24   Theadore Ozell HERO, MD  psyllium (METAMUCIL SMOOTH TEXTURE) 58.6 % powder Take 1 packet by mouth 3 (three) times daily. 02/16/24   Geroldine Berg, MD    Allergies: Patient has no known allergies.    Review of Systems  Gastrointestinal:  Negative for abdominal pain and vomiting.  Genitourinary:  Positive for dysuria. Negative for flank pain and testicular pain.    Updated Vital Signs BP 101/61 (BP Location: Right Arm)   Pulse 94   Temp 98.5 F (36.9 C) (Oral)   Resp 15   Ht 5' 7 (1.702 m)   Wt 81.2 kg   SpO2 95%   BMI 28.04 kg/m   Physical Exam Vitals and nursing note reviewed.  Constitutional:      Appearance: He is well-developed.  HENT:     Head: Normocephalic and atraumatic.  Pulmonary:     Effort:  Pulmonary effort is normal.  Abdominal:     General: There is no distension.     Palpations: Abdomen is soft.     Tenderness: There is no abdominal tenderness. There is no right CVA tenderness or left CVA tenderness.  Genitourinary:    Comments: Patient defers GU exam Skin:    General: Skin is warm and dry.  Neurological:     Mental Status: He is alert.     (all labs ordered are listed, but only abnormal results are displayed) Labs Reviewed  URINALYSIS, ROUTINE W REFLEX MICROSCOPIC  GC/CHLAMYDIA PROBE AMP ([place]) NOT AT [facility]    EKG: None  Radiology: No results found.   Procedures   Medications Ordered in the ED  cefTRIAXone  (ROCEPHIN ) injection 500 mg (has no administration in time range)  doxycycline  (VIBRA -TABS) tablet 100 mg (has no administration in time range)                                    Medical Decision Making Amount and/or Complexity of Data Reviewed Labs: ordered.  Risk Prescription drug management.   I suspect patient has urethral-itis from STI.  He has had these symptoms before.  He does have hematuria, but  has had this before, including when he had gonorrhea in December 2024.  He is not exhibiting signs or symptoms of obvious ureteral stone.  I do not think CT is warranted.  Is much more likely that he has STI rather than UTI.  Was given IM Rocephin  and doxycycline .  He declines GU exam.  He denies any testicular pain.  He appears stable for discharge home with return precautions.  I recommended he follow-up with the health department.     Final diagnoses:  Urethritis    ED Discharge Orders          Ordered    doxycycline  (VIBRAMYCIN ) 100 MG capsule  2 times daily        07/17/24 2225               Freddi Hamilton, MD 07/17/24 2246

## 2024-07-17 NOTE — Discharge Instructions (Addendum)
 Follow-up with the health department.  Do not have intercourse until you have completed your antibiotics and all of your symptoms have gone away.  Use protection.  If you develop any abdominal pain, flank or back pain, fever, or any other new/concerning symptoms then return to the ER.

## 2024-07-19 LAB — GC/CHLAMYDIA PROBE AMP (~~LOC~~) NOT AT ARMC
Chlamydia: POSITIVE — AB
Comment: NEGATIVE
Comment: NORMAL
Neisseria Gonorrhea: POSITIVE — AB
